# Patient Record
Sex: Male | Born: 1945 | Race: White | Hispanic: No | Marital: Married | Smoking: Current every day smoker
Health system: Southern US, Community
[De-identification: ages and names within clinical notes are randomized; demographics above are authoritative.]

## PROBLEM LIST (undated history)

## (undated) DIAGNOSIS — N486 Induration penis plastica: Secondary | ICD-10-CM

## (undated) DIAGNOSIS — F419 Anxiety disorder, unspecified: Secondary | ICD-10-CM

## (undated) DIAGNOSIS — E785 Hyperlipidemia, unspecified: Secondary | ICD-10-CM

## (undated) DIAGNOSIS — I469 Cardiac arrest, cause unspecified: Secondary | ICD-10-CM

## (undated) DIAGNOSIS — N200 Calculus of kidney: Secondary | ICD-10-CM

## (undated) DIAGNOSIS — I219 Acute myocardial infarction, unspecified: Secondary | ICD-10-CM

## (undated) DIAGNOSIS — H8110 Benign paroxysmal vertigo, unspecified ear: Secondary | ICD-10-CM

## (undated) DIAGNOSIS — J449 Chronic obstructive pulmonary disease, unspecified: Secondary | ICD-10-CM

## (undated) DIAGNOSIS — K449 Diaphragmatic hernia without obstruction or gangrene: Secondary | ICD-10-CM

## (undated) HISTORY — DX: Calculus of kidney: N20.0

## (undated) HISTORY — DX: Benign paroxysmal vertigo, unspecified ear: H81.10

## (undated) HISTORY — DX: Anxiety disorder, unspecified: F41.9

## (undated) HISTORY — PX: NOSE SURGERY: SHX723

## (undated) HISTORY — PX: TONSILLECTOMY: SUR1361

## (undated) HISTORY — DX: Hyperlipidemia, unspecified: E78.5

## (undated) HISTORY — PX: BACK SURGERY: SHX140

## (undated) HISTORY — DX: Acute myocardial infarction, unspecified: I21.9

## (undated) HISTORY — DX: Induration penis plastica: N48.6

## (undated) HISTORY — DX: Cardiac arrest, cause unspecified: I46.9

## (undated) HISTORY — DX: Diaphragmatic hernia without obstruction or gangrene: K44.9

## (undated) HISTORY — DX: Chronic obstructive pulmonary disease, unspecified: J44.9

---

## 2021-09-28 DIAGNOSIS — R9431 Abnormal electrocardiogram [ECG] [EKG]: Secondary | ICD-10-CM | POA: Insufficient documentation

## 2021-09-28 NOTE — Progress Notes (Addendum)
Cardiology Office Note   Date:  09/30/2021   ID:  Arthur Sullivan, DOB 1946-01-27, MRN [id number]  PCP:  Oneita Hurt, No  Cardiologist:   Rollene Rotunda, MD Referring:  Veverly Fells, MD  Chief Complaint  Patient presents with   Shortness of Breath   Neck Pain       History of Present Illness: Arthur Sullivan is a 75 y.o. male who presents for evaluation of throat pain.  He was referred by Veverly Fells, MD. he has an extensive cardiac history but unfortunately I cannot get any access to these records.  We are looking for them in Care Everywhere because he says he was recently treated both at [facility] and [facility] earlier this year.  He said in 2001 he had 4 stents at [facility]  In 2004 he had 1 stent.  He was at [facility] he said in March of this year and required a stent in [facility] in April and got a balloon.  However, I cannot access these results.     He says that he is still having throat pain although it is not nearly as severe as it was in March.  He has not taken any nitroglycerin because he says he has an allergy to it.  He was seen by cardiologist but he wanted to switch to our office.  The last visit they tried apparently to give him some isosorbide but he said he has not tolerated this.  He said the he said that the throat discomfort comes sporadically.  It seems to happen at rest.  He is not describing really new substernal chest pressure, arm discomfort, associated nausea vomiting or diaphoresis.  He does have some chronic shortness of breath but he is not describing PND or orthopnea.  He describes pain in both of his feet.  He has had no weight gain or edema.  Of note apparently to evaluate his rib pain he did see ENT but they did not think it was related to an ear nose and throat issue.  He does not report any extensive examination or evaluation   Past Medical History:  Diagnosis Date   Anxiety    Benign positional vertigo    Cardiac arrest [facility])    History of this  with a report of 5 stents.   COPD (chronic obstructive pulmonary disease) (HCC)    Dyslipidemia    Hiatal hernia    Hyperlipidemia    Kidney stones    Myocardial infarction (HCC)    Peyronie's disease     Past Surgical History:  Procedure Laterality Date   BACK SURGERY     NOSE SURGERY     TONSILLECTOMY       Current Outpatient Medications  Medication Sig Dispense Refill   albuterol (VENTOLIN HFA) 108 (90 Base) MCG/ACT inhaler Inhale into the lungs.     ALPHAGAN P 0.1 % SOLN Apply to eye.     aspirin EC 81 MG tablet Take 81 mg by mouth daily. Swallow whole.     atorvastatin (LIPITOR) 40 MG tablet Take 40 mg by mouth daily as needed.     clopidogrel (PLAVIX) 75 MG tablet Take 75 mg by mouth daily.     fluticasone (FLONASE) 50 MCG/ACT nasal spray Place into both nostrils.     furosemide (LASIX) 20 MG tablet Take 20 mg by mouth daily.     latanoprost (XALATAN) 0.005 % ophthalmic solution SMARTSIG:In Eye(s)     LORazepam (ATIVAN)  0.5 MG tablet Take 0.5 mg by mouth 3 (three) times daily as needed.     metoprolol tartrate (LOPRESSOR) 25 MG tablet Take 25 mg by mouth 2 (two) times daily.     pantoprazole (PROTONIX) 40 MG tablet Take 40 mg by mouth daily.     timolol (TIMOPTIC) 0.5 % ophthalmic solution SMARTSIG:In Eye(s)     isosorbide mononitrate (IMDUR) 30 MG 24 hr tablet Take 15 mg by mouth daily. (Patient not taking: Reported on 09/30/2021)     No current facility-administered medications for this visit.    Allergies:   Patient has no allergy information on record. 2   Social History:  The patient  reports that he has been smoking cigarettes. He has never used smokeless tobacco.   Family History:   No family history of CAD.   ROS:  Please see the history of present illness.   Otherwise, review of systems are positive for none.   All other systems are reviewed and negative.    PHYSICAL EXAM: VS:  BP (!) 145/76    Pulse (!) 54    Ht 5\' 5"  (1.651 m)    Wt 124 lb 3.2 oz  (56.3 kg)    SpO2 99%    BMI 20.67 kg/m  , BMI Body mass index is 20.67 kg/m. GENERAL:  Well appearing HEENT:  Pupils equal round and reactive, fundi not visualized, oral mucosa unremarkable NECK:  No jugular venous distention, waveform within normal limits, carotid upstroke brisk and symmetric, no bruits, no thyromegaly LYMPHATICS:  No cervical, inguinal adenopathy LUNGS:  Clear to auscultation bilaterally BACK:  No CVA tenderness CHEST:  Unremarkable HEART:  PMI not displaced or sustained,S1 and S2 within normal limits, no S3, no S4, no clicks, no rubs, no murmurs ABD:  Flat, positive bowel sounds normal in frequency in pitch, no bruits, no rebound, no guarding, no midline pulsatile mass, no hepatomegaly, no splenomegaly EXT:  2 plus pulses throughout, no edema, no cyanosis no clubbing SKIN:  No rashes no nodules NEURO:  Cranial nerves II through XII grossly intact, motor grossly intact throughout PSYCH:  Cognitively intact, oriented to person place and time   EKG:  EKG is ordered today. The ekg ordered today demonstrates sinus rhythm, rate 54, axis within normal limits, intervals within normal limits, no acute ST-T wave changes.   Recent Labs: No results found for requested labs within last 8760 hours.    Lipid Panel No results found for: CHOL, TRIG, HDL, CHOLHDL, VLDL, LDLCALC, LDLDIRECT    Wt Readings from Last 3 Encounters:  09/30/21 124 lb 3.2 oz (56.3 kg)      Other studies Reviewed: Additional studies/ records that were reviewed today include: [facility]. Review of the above records demonstrates:  Please see elsewhere in the note.     ASSESSMENT AND PLAN:  THROAT PAIN:  I would like to understand his anatomy with the plan as below.   SOB: I would like to start with pulmonary function testing.  Of course he needs to stop smoking.    FOOT PAIN:    I will check ABIs.      TOBACCO ABUSE:  He understands the need to quit smoking.    CAD:  It is unclear whether his throat pain is related to his coronary disease.  I need to first understand his coronary anatomy so we are trying to call [place] and [facility] to get those records.  He does not want to take nitroglycerin.  He does not want to try the Imdur.  At this point I do not want to start Ranexa until I understand more about his coronary anatomy.  He seems to be a stable pattern improved from earlier this year.   ADDENDUM: I was able to find records from both [place] and at [facility].  In March he was at [place].  He ruled in for NSTEMI initially but left AGAINST MEDICAL ADVICE.  He had recurrent chest discomfort and came in and was found to have LAD 20 to 30% stenosis, left circumflex 99% stenosis and a previous stent and it was not thought to be amenable to PCI.  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  In early April at [facility] catheterization demonstrated that the RCA was patent.  At [facility] it looks like they were not able to do a balloon angioplasty but could not pass a stent.  Was described as a tortuous origin with calcification of this OM.  No stent was deployed.  They suggested that no further attempts at the OM and revascularize should be attempted because of the poor target.  Current medicines are reviewed at length with the patient today.  The patient does not have concerns regarding medicines.  The following changes have been made:  no change  Labs/ tests ordered today include:   Orders Placed This Encounter  Procedures   EKG 12-Lead   Pulmonary Function Test   VAS [place] ABI WITH/WO TBI      Disposition:   FU with me after the above testing.  He can see me in [place].      Signed, Rollene Rotunda, MD  09/30/2021 12:34 PM     [facility]

## 2021-09-30 ENCOUNTER — Encounter: Payer: Self-pay | Admitting: Cardiology

## 2021-09-30 ENCOUNTER — Other Ambulatory Visit: Payer: Self-pay

## 2021-09-30 ENCOUNTER — Ambulatory Visit (INDEPENDENT_AMBULATORY_CARE_PROVIDER_SITE_OTHER): Payer: Medicare Other | Admitting: Cardiology

## 2021-09-30 VITALS — BP 145/76 | HR 54 | Ht 65.0 in | Wt 124.2 lb

## 2021-09-30 DIAGNOSIS — M79606 Pain in leg, unspecified: Secondary | ICD-10-CM | POA: Diagnosis not present

## 2021-09-30 DIAGNOSIS — R9431 Abnormal electrocardiogram [ECG] [EKG]: Secondary | ICD-10-CM

## 2021-09-30 NOTE — Patient Instructions (Signed)
Medication Instructions:  Your Physician recommend you continue on your current medication as directed.    *If you need a refill on your cardiac medications before your next appointment, please call your pharmacy*  Testing/Procedures: Your physician has requested that you have an ankle brachial index (ABI). During this test an ultrasound and blood pressure cuff are used to evaluate the arteries that supply the arms and legs with blood. Allow thirty minutes for this exam. There are no restrictions or special instructions.  Your physician has recommended that you have a pulmonary function test. Pulmonary Function Tests are a group of tests that measure how well air moves in and out of your lungs.  Follow-Up: At [facility], you and your health needs are our priority.  As part of our continuing mission to provide you with exceptional heart care, we have created designated Provider Care Teams.  These Care Teams include your primary Cardiologist (physician) and Advanced Practice Providers (APPs -  Physician Assistants and Nurse Practitioners) who all work together to provide you with the care you need, when you need it.  We recommend signing up for the patient portal called "MyChart".  Sign up information is provided on this After Visit Summary.  MyChart is used to connect with patients for Virtual Visits (Telemedicine).  Patients are able to view lab/test results, encounter notes, upcoming appointments, etc.  Non-urgent messages can be sent to your provider as well.   To learn more about what you can do with MyChart, go to [email or web address].    Your next appointment:   2 week(s)  The format for your next appointment:   In Person  Provider:   Rollene Rotunda, MD

## 2021-10-27 ENCOUNTER — Other Ambulatory Visit: Payer: Self-pay

## 2021-10-27 ENCOUNTER — Ambulatory Visit (HOSPITAL_COMMUNITY)
Admission: RE | Admit: 2021-10-27 | Discharge: 2021-10-27 | Disposition: A | Discharge status: Home / Self Care | Payer: Commercial Managed Care - HMO | Source: Ambulatory Visit | Attending: Cardiology | Admitting: Cardiology

## 2021-10-27 DIAGNOSIS — M79606 Pain in leg, unspecified: Secondary | ICD-10-CM | POA: Insufficient documentation

## 2021-10-27 DIAGNOSIS — R9431 Abnormal electrocardiogram [ECG] [EKG]: Secondary | ICD-10-CM | POA: Diagnosis not present

## 2021-10-29 ENCOUNTER — Other Ambulatory Visit (HOSPITAL_COMMUNITY)
Admission: RE | Admit: 2021-10-29 | Discharge: 2021-10-29 | Disposition: A | Discharge status: Home / Self Care | Payer: Commercial Managed Care - HMO | Source: Ambulatory Visit | Attending: Cardiology | Admitting: Cardiology

## 2021-10-29 DIAGNOSIS — Z01812 Encounter for preprocedural laboratory examination: Secondary | ICD-10-CM | POA: Diagnosis present

## 2021-10-29 DIAGNOSIS — Z20822 Contact with and (suspected) exposure to covid-19: Secondary | ICD-10-CM | POA: Diagnosis not present

## 2021-10-29 DIAGNOSIS — R9431 Abnormal electrocardiogram [ECG] [EKG]: Secondary | ICD-10-CM | POA: Insufficient documentation

## 2021-10-30 LAB — SARS CORONAVIRUS 2 (TAT 6-24 HRS): SARS Coronavirus 2: NEGATIVE

## 2021-11-02 ENCOUNTER — Other Ambulatory Visit: Payer: Self-pay

## 2021-11-02 ENCOUNTER — Ambulatory Visit (HOSPITAL_COMMUNITY)
Admission: RE | Admit: 2021-11-02 | Discharge: 2021-11-02 | Disposition: A | Discharge status: Home / Self Care | Payer: Medicare Other | Source: Ambulatory Visit | Attending: Cardiology | Admitting: Cardiology

## 2021-11-02 ENCOUNTER — Encounter (HOSPITAL_COMMUNITY): Payer: Medicare Other

## 2021-11-02 DIAGNOSIS — R9431 Abnormal electrocardiogram [ECG] [EKG]: Secondary | ICD-10-CM | POA: Diagnosis present

## 2021-11-02 DIAGNOSIS — Z720 Tobacco use: Secondary | ICD-10-CM | POA: Insufficient documentation

## 2021-11-02 DIAGNOSIS — R0609 Other forms of dyspnea: Secondary | ICD-10-CM | POA: Insufficient documentation

## 2021-11-02 DIAGNOSIS — R0602 Shortness of breath: Secondary | ICD-10-CM | POA: Insufficient documentation

## 2021-11-02 DIAGNOSIS — F17210 Nicotine dependence, cigarettes, uncomplicated: Secondary | ICD-10-CM | POA: Insufficient documentation

## 2021-11-02 DIAGNOSIS — I2510 Atherosclerotic heart disease of native coronary artery without angina pectoris: Secondary | ICD-10-CM | POA: Insufficient documentation

## 2021-11-02 LAB — PULMONARY FUNCTION TEST
DL/VA % pred: 83 %
DL/VA: 3.4 ml/min/mmHg/L
DLCO unc % pred: 62 %
DLCO unc: 13.42 ml/min/mmHg
FEF 25-75 Post: 0.78 L/sec
FEF 25-75 Pre: 0.85 L/sec
FEF2575-%Change-Post: -8 %
FEF2575-%Pred-Post: 42 %
FEF2575-%Pred-Pre: 46 %
FEV1-%Change-Post: -3 %
FEV1-%Pred-Post: 55 %
FEV1-%Pred-Pre: 57 %
FEV1-Post: 1.39 L
FEV1-Pre: 1.44 L
FEV1FVC-%Change-Post: 5 %
FEV1FVC-%Pred-Pre: 94 %
FEV6-%Change-Post: -9 %
FEV6-%Pred-Post: 58 %
FEV6-%Pred-Pre: 64 %
FEV6-Post: 1.9 L
FEV6-Pre: 2.09 L
FEV6FVC-%Change-Post: 0 %
FEV6FVC-%Pred-Post: 106 %
FEV6FVC-%Pred-Pre: 107 %
FVC-%Change-Post: -8 %
FVC-%Pred-Post: 55 %
FVC-%Pred-Pre: 60 %
FVC-Post: 1.92 L
FVC-Pre: 2.1 L
Post FEV1/FVC ratio: 72 %
Post FEV6/FVC ratio: 99 %
Pre FEV1/FVC ratio: 69 %
Pre FEV6/FVC Ratio: 100 %
RV % pred: 133 %
RV: 3.01 L
TLC % pred: 85 %
TLC: 5.18 L

## 2021-11-02 MED ORDER — ALBUTEROL SULFATE (2.5 MG/3ML) 0.083% IN NEBU
2.5000 mg | INHALATION_SOLUTION | Freq: Once | RESPIRATORY_TRACT | Status: AC
  Administered 2021-11-02: 2.5 mg via RESPIRATORY_TRACT

## 2021-11-02 NOTE — Progress Notes (Addendum)
Cardiology Office Note   Date:  11/02/2021   ID:  [name], [name] 12/20/1945, MRN [id number]  PCP:  Veverly Fells, MD  Cardiologist:   Rollene Rotunda, MD Referring:  No ref. provider found  No chief complaint on file.      History of Present Illness: Arthur Sullivan is a 76 y.o. male who presents for evaluation of CAD.  He said in 2001 he had 4 stents at [facility].  In 2004 he had 1 stent.  He was at [facility] he said in March of this 2022 and required a stent .  In [facility] in April and got a balloon.  At the last visit I had limited access to records.   I was eventually able to find records from both [place] and at [facility].  In March 2022 he was at [place].  He ruled in for NSTEMI initially but left AGAINST MEDICAL ADVICE.  He had recurrent chest discomfort and came in and was found to have LAD 20 to 30% stenosis, left circumflex 99% stenosis and a previous stent and it was not thought to be amenable to PCI.  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  In early April at [facility] catheterization demonstrated that the RCA was patent.  At [facility] it looks like they were not able to do a balloon angioplasty but could not pass a stent.  Was described as a tortuous origin with calcification of this OM.  No stent was deployed.  They suggested that no further attempts at the OM and revascularize should be attempted because of the poor target.  I sent him for evaluation of SOB and he has moderately severe airway obstruction.  I sent him for ABIs for foot pain and these were normal.  He continues to get throat discomfort.  This is with minimal activity.  He actually cannot take sublingual nitroglycerin because it makes him have more pain.  He thinks the discomfort in his throat is similar to previous angina and getting worse.  It happens when he is just walking across level ground.  He has not had any resting and is not having any PND or orthopnea.  He  does have chronic dyspnea with exertion.  He has had no chest pressure or arm discomfort.  He does not have palpitations, presyncope or syncope.   Past Medical History:  Diagnosis Date   Anxiety    Benign positional vertigo    Cardiac arrest [facility])    History of this with a report of 5 stents.   COPD (chronic obstructive pulmonary disease) (HCC)    Dyslipidemia    Hiatal hernia    Hyperlipidemia    Kidney stones    Myocardial infarction (HCC)    Peyronie's disease     Past Surgical History:  Procedure Laterality Date   BACK SURGERY     NOSE SURGERY     TONSILLECTOMY       Current Outpatient Medications  Medication Sig Dispense Refill   albuterol (VENTOLIN HFA) 108 (90 Base) MCG/ACT inhaler Inhale into the lungs.     ALPHAGAN P 0.1 % SOLN Apply to eye.     aspirin EC 81 MG tablet Take 81 mg by mouth daily. Swallow whole.     atorvastatin (LIPITOR) 40 MG tablet Take 40 mg by mouth daily as needed.     clopidogrel (PLAVIX) 75 MG tablet Take 75 mg by mouth daily.  fluticasone (FLONASE) 50 MCG/ACT nasal spray Place into both nostrils.     furosemide (LASIX) 20 MG tablet Take 20 mg by mouth daily.     isosorbide mononitrate (IMDUR) 30 MG 24 hr tablet Take 15 mg by mouth daily. (Patient not taking: Reported on 09/30/2021)     latanoprost (XALATAN) 0.005 % ophthalmic solution SMARTSIG:In Eye(s)     LORazepam (ATIVAN) 0.5 MG tablet Take 0.5 mg by mouth 3 (three) times daily as needed.     metoprolol tartrate (LOPRESSOR) 25 MG tablet Take 25 mg by mouth 2 (two) times daily.     pantoprazole (PROTONIX) 40 MG tablet Take 40 mg by mouth daily.     timolol (TIMOPTIC) 0.5 % ophthalmic solution SMARTSIG:In Eye(s)     No current facility-administered medications for this visit.    Allergies:   Patient has no allergy information on record. 2  ROS:  Please see the history of present illness.   Otherwise, review of systems are positive for none.   All other systems are reviewed and  negative.    PHYSICAL EXAM: VS:  There were no vitals taken for this visit. , BMI There is no height or weight on file to calculate BMI. GENERAL:  Well appearing NECK:  No jugular venous distention, waveform within normal limits, carotid upstroke brisk and symmetric, bilateral carotid bruits bruits, no thyromegaly LUNGS:  Clear to auscultation bilaterally CHEST:  Unremarkable HEART:  PMI not displaced or sustained,S1 and S2 within normal limits, no S3, no S4, no clicks, no rubs,  2 out of 6 apical systolic murmur radiating slightly up aortic outflow tract, no diastolic ABD:  Flat, positive bowel sounds normal in frequency in pitch, no bruits, no rebound, no guarding, no midline pulsatile mass, no hepatomegaly, no splenomegaly EXT:  2 plus pulses diminished dorsalis pedis and posterior tibialis bilaterally, no edema, no cyanosis no clubbing   EKG:  EKG is not ordered today. The ekg ordered today demonstrates sinus rhythm, rate 55 axis within normal limits, intervals within normal limits, no acute ST-T wave changes.   Recent Labs: No results found for requested labs within last 8760 hours.    Lipid Panel No results found for: CHOL, TRIG, HDL, CHOLHDL, VLDL, LDLCALC, LDLDIRECT    Wt Readings from Last 3 Encounters:  09/30/21 124 lb 3.2 oz (56.3 kg)      Other studies Reviewed: Additional studies/ records that were reviewed today include: [facility] and [facility] records  ( (Greater than 40 minutes reviewing all data with greater than 50% face to face with the patient). Review of the above records demonstrates:  Please see elsewhere in the note.     ASSESSMENT AND PLAN:  THROAT PAIN:    His pain is consistent with unstable angina.  He does not tolerate Imdur.  I am going to start Ranexa 500 mg twice daily.  Of note I did review the report from his 2 previous hospitalizations.  Given the unstable nature of his symptoms I think it is reasonable to consider another cardiac catheterization.  The  patient understands that risks included but are not limited to stroke (1 in 1000), death (1 in 1000), kidney failure [usually temporary] (1 in 500), bleeding (1 in 200), allergic reaction [possibly serious] (1 in 200).  The patient understands and agrees to proceed.      SOB:    He needs a pulmonary appointment and I will arrange.    FOOT PAIN:    He had normal ABIs.  TOBACCO ABUSE: He understands the need to stop smoking. ° °CAD:   This will be addressed as above. ° °CAROTID BRUITS: I will check carotid Dopplers ° °ANEMIA: I note that he has an anemia at least to get in April.  I will be checking a CBC with his routine labs but has had no active bleeding.  He does not know that he had any anemia.  He has been tolerating the aspirin and Plavix. ° ° °ADDENDUM: After arranging the catheterization.  The patient decided not to have the procedure but does agree to take medication and come to the emergency room via EMS if he has increasing unstable symptoms.  He understands the risk and accepts his decision not to have a catheterization. ° ° °Current medicines are reviewed at length with the patient today.  The patient does not have concerns regarding medicines. ° °The following changes have been made: As above ° °Labs/ tests ordered today include:    ° °No orders of the defined types were placed in this encounter. ° ° ° ° °Disposition:   Follow-up with me after the cardiac cath ° ° °Signed, °Jasmaine Rochel, MD  °11/02/2021 1:31 PM    °[facility] ° ° ° °

## 2021-11-02 NOTE — H&P (View-Only) (Signed)
Cardiology Office Note   Date:  11/02/2021   ID:  [name], [name] 12/20/1945, MRN [id number]  PCP:  Veverly Fells, MD  Cardiologist:   Rollene Rotunda, MD Referring:  No ref. provider found  No chief complaint on file.      History of Present Illness: Arthur Sullivan is a 76 y.o. male who presents for evaluation of CAD.  He said in 2001 he had 4 stents at [facility].  In 2004 he had 1 stent.  He was at [facility] he said in March of this 2022 and required a stent .  In [facility] in April and got a balloon.  At the last visit I had limited access to records.   I was eventually able to find records from both [place] and at [facility].  In March 2022 he was at [facility].  He ruled in for NSTEMI initially but left AGAINST MEDICAL ADVICE.  He had recurrent chest discomfort and came in and was found to have LAD 20 to 30% stenosis, left circumflex 99% stenosis and a previous stent and it was not thought to be amenable to PCI.  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  In early April at [facility] catheterization demonstrated that the RCA was patent.  At [facility] it looks like they were not able to do a balloon angioplasty but could not pass a stent.  Was described as a tortuous origin with calcification of this OM.  No stent was deployed.  They suggested that no further attempts at the OM and revascularize should be attempted because of the poor target.  I sent him for evaluation of SOB and he has moderately severe airway obstruction.  I sent him for ABIs for foot pain and these were normal.  He continues to get throat discomfort.  This is with minimal activity.  He actually cannot take sublingual nitroglycerin because it makes him have more pain.  He thinks the discomfort in his throat is similar to previous angina and getting worse.  It happens when he is just walking across level ground.  He has not had any resting and is not having any PND or orthopnea.  He  does have chronic dyspnea with exertion.  He has had no chest pressure or arm discomfort.  He does not have palpitations, presyncope or syncope.   Past Medical History:  Diagnosis Date   Anxiety    Benign positional vertigo    Cardiac arrest [facility])    History of this with a report of 5 stents.   COPD (chronic obstructive pulmonary disease) (HCC)    Dyslipidemia    Hiatal hernia    Hyperlipidemia    Kidney stones    Myocardial infarction (HCC)    Peyronie's disease     Past Surgical History:  Procedure Laterality Date   BACK SURGERY     NOSE SURGERY     TONSILLECTOMY       Current Outpatient Medications  Medication Sig Dispense Refill   albuterol (VENTOLIN HFA) 108 (90 Base) MCG/ACT inhaler Inhale into the lungs.     ALPHAGAN P 0.1 % SOLN Apply to eye.     aspirin EC 81 MG tablet Take 81 mg by mouth daily. Swallow whole.     atorvastatin (LIPITOR) 40 MG tablet Take 40 mg by mouth daily as needed.     clopidogrel (PLAVIX) 75 MG tablet Take 75 mg by mouth daily.  fluticasone (FLONASE) 50 MCG/ACT nasal spray Place into both nostrils.     furosemide (LASIX) 20 MG tablet Take 20 mg by mouth daily.     isosorbide mononitrate (IMDUR) 30 MG 24 hr tablet Take 15 mg by mouth daily. (Patient not taking: Reported on 09/30/2021)     latanoprost (XALATAN) 0.005 % ophthalmic solution SMARTSIG:In Eye(s)     LORazepam (ATIVAN) 0.5 MG tablet Take 0.5 mg by mouth 3 (three) times daily as needed.     metoprolol tartrate (LOPRESSOR) 25 MG tablet Take 25 mg by mouth 2 (two) times daily.     pantoprazole (PROTONIX) 40 MG tablet Take 40 mg by mouth daily.     timolol (TIMOPTIC) 0.5 % ophthalmic solution SMARTSIG:In Eye(s)     No current facility-administered medications for this visit.    Allergies:   Patient has no allergy information on record. 2  ROS:  Please see the history of present illness.   Otherwise, review of systems are positive for none.   All other systems are reviewed and  negative.    PHYSICAL EXAM: VS:  There were no vitals taken for this visit. , BMI There is no height or weight on file to calculate BMI. GENERAL:  Well appearing NECK:  No jugular venous distention, waveform within normal limits, carotid upstroke brisk and symmetric, bilateral carotid bruits bruits, no thyromegaly LUNGS:  Clear to auscultation bilaterally CHEST:  Unremarkable HEART:  PMI not displaced or sustained,S1 and S2 within normal limits, no S3, no S4, no clicks, no rubs,  2 out of 6 apical systolic murmur radiating slightly up aortic outflow tract, no diastolic ABD:  Flat, positive bowel sounds normal in frequency in pitch, no bruits, no rebound, no guarding, no midline pulsatile mass, no hepatomegaly, no splenomegaly EXT:  2 plus pulses diminished dorsalis pedis and posterior tibialis bilaterally, no edema, no cyanosis no clubbing   EKG:  EKG is not ordered today. The ekg ordered today demonstrates sinus rhythm, rate 55 axis within normal limits, intervals within normal limits, no acute ST-T wave changes.   Recent Labs: No results found for requested labs within last 8760 hours.    Lipid Panel No results found for: CHOL, TRIG, HDL, CHOLHDL, VLDL, LDLCALC, LDLDIRECT    Wt Readings from Last 3 Encounters:  09/30/21 124 lb 3.2 oz (56.3 kg)      Other studies Reviewed: Additional studies/ records that were reviewed today include: [facility] and [facility] records  ( (Greater than 40 minutes reviewing all data with greater than 50% face to face with the patient). Review of the above records demonstrates:  Please see elsewhere in the note.     ASSESSMENT AND PLAN:  THROAT PAIN:    His pain is consistent with unstable angina.  He does not tolerate Imdur.  I am going to start Ranexa 500 mg twice daily.  Of note I did review the report from his 2 previous hospitalizations.  Given the unstable nature of his symptoms I think it is reasonable to consider another cardiac catheterization.  The  patient understands that risks included but are not limited to stroke (1 in 1000), death (1 in 1000), kidney failure [usually temporary] (1 in 500), bleeding (1 in 200), allergic reaction [possibly serious] (1 in 200).  The patient understands and agrees to proceed.      SOB:    He needs a pulmonary appointment and I will arrange.    FOOT PAIN:    He had normal ABIs.  TOBACCO ABUSE: He understands the need to stop smoking.  CAD:   This will be addressed as above.  CAROTID BRUITS: I will check carotid Dopplers  ANEMIA: I note that he has an anemia at least to get in April.  I will be checking a CBC with his routine labs but has had no active bleeding.  He does not know that he had any anemia.  He has been tolerating the aspirin and Plavix.   ADDENDUM: After arranging the catheterization.  The patient decided not to have the procedure but does agree to take medication and come to the emergency room via EMS if he has increasing unstable symptoms.  He understands the risk and accepts his decision not to have a catheterization.   Current medicines are reviewed at length with the patient today.  The patient does not have concerns regarding medicines.  The following changes have been made: As above  Labs/ tests ordered today include:     No orders of the defined types were placed in this encounter.     Disposition:   Follow-up with me after the cardiac cath   Signed, Rollene RotundaJames Deshanti Adcox, MD  11/02/2021 1:31 PM    [facility]

## 2021-11-03 ENCOUNTER — Other Ambulatory Visit: Payer: Self-pay | Admitting: *Deleted

## 2021-11-03 ENCOUNTER — Ambulatory Visit (INDEPENDENT_AMBULATORY_CARE_PROVIDER_SITE_OTHER): Payer: Medicare Other | Admitting: Cardiology

## 2021-11-03 ENCOUNTER — Other Ambulatory Visit: Payer: Self-pay

## 2021-11-03 ENCOUNTER — Encounter: Payer: Self-pay | Admitting: Cardiology

## 2021-11-03 VITALS — BP 136/66 | HR 57 | Ht 65.0 in | Wt 127.2 lb

## 2021-11-03 DIAGNOSIS — R0602 Shortness of breath: Secondary | ICD-10-CM

## 2021-11-03 DIAGNOSIS — Z01812 Encounter for preprocedural laboratory examination: Secondary | ICD-10-CM

## 2021-11-03 DIAGNOSIS — Z720 Tobacco use: Secondary | ICD-10-CM

## 2021-11-03 DIAGNOSIS — Z01818 Encounter for other preprocedural examination: Secondary | ICD-10-CM

## 2021-11-03 DIAGNOSIS — I2510 Atherosclerotic heart disease of native coronary artery without angina pectoris: Secondary | ICD-10-CM

## 2021-11-03 DIAGNOSIS — I25118 Atherosclerotic heart disease of native coronary artery with other forms of angina pectoris: Secondary | ICD-10-CM

## 2021-11-03 DIAGNOSIS — R0989 Other specified symptoms and signs involving the circulatory and respiratory systems: Secondary | ICD-10-CM

## 2021-11-03 MED ORDER — RANOLAZINE ER 500 MG PO TB12
500.0000 mg | ORAL_TABLET | Freq: Two times a day (BID) | ORAL | 11 refills | Status: DC
Start: 2021-11-03 — End: 2021-12-10

## 2021-11-03 NOTE — Patient Instructions (Addendum)
Medication Instructions:  Please start Ranexa 500 mg one tablet twice a day. Continue all other medications as listed.  *If you need a refill on your cardiac medications before your next appointment, please call your pharmacy*   Lab Work: Please have blood work at your closest [facility]  If you have labs (blood work) drawn today and your tests are completely normal, you will receive your results only by: MyChart Message (if you have MyChart) OR A paper copy in the mail If you have any lab test that is abnormal or we need to change your treatment, we will call you to review the results.   Testing/Procedures: Your physician has requested that you have a carotid duplex. This test is an ultrasound of the carotid arteries in your neck. It looks at blood flow through these arteries that supply the brain with blood. Allow one hour for this exam. There are no restrictions or special instructions.  This will be completed at [facility].  You will be contacted to be scheduled.  You have been referred to pulmonary for the evaluation of shortness of breath.  You will be  contacted to be scheduled.   [facility] CARDIOVASCULAR DIVISION [facility] [place] [address] [address] Dept: [phone] Loc: [phone]  VISHNU MOELLER  11/03/2021    PT WILL CALL BACK TO RESCHEDULE CATH!!!  You are scheduled for a Cardiac Catheterization on Tuesday, January 24 with Dr. Nicki Guadalajara.  1. Please arrive at the [facility] (Main Entrance A) at [facility]: [address] [place], [place] [address] at 7:00 AM (This time is two hours before your procedure to ensure your preparation). Free valet parking service is available.   Special note: Every effort is made to have your procedure done on time. Please understand that emergencies sometimes delay scheduled procedures.  2. Diet: Do not eat solid foods after midnight.  The patient may have clear  liquids until 5am upon the day of the procedure.  3. Labs: You will need to have blood work as instructed at your closest [place]. You do not need to be fasting.  4. Medication instructions in preparation for your procedure:   Contrast Allergy: No  Please do not take your Lasix this AM  On the morning of your procedure, take your ASA and  Plavix/Clopidogrel and any morning medicines NOT listed above.  You may use sips of water.  5. Plan for one night stay--bring personal belongings. 6. Bring a current list of your medications and current insurance cards. 7. You MUST have a responsible person to drive you home. 8. Someone MUST be with you the first 24 hours after you arrive home or your discharge will be delayed. 9. Please wear clothes that are easy to get on and off and wear slip-on shoes.  Thank you for allowing [place] to care for you!   -- [facility]  Follow-Up: At [facility], you and your health needs are our priority.  As part of our continuing mission to provide you with exceptional heart care, we have created designated Provider Care Teams.  These Care Teams include your primary Cardiologist (physician) and Advanced Practice Providers (APPs -  Physician Assistants and Nurse Practitioners) who all work together to provide you with the care you need, when you need it.  We recommend signing up for the patient portal called "MyChart".  Sign up information is provided on this After Visit Summary.  MyChart is used to connect with patients for Virtual Visits (Telemedicine).  Patients are able to view lab/test results, encounter notes, upcoming appointments, etc.  Non-urgent messages can be sent to your provider as well.   To learn more about what you can do with MyChart, go to [email or web address].    Your next appointment:   Follow up with Dr Antoine Poche after the above testing.  Thank you for choosing [facility]!!

## 2021-11-08 ENCOUNTER — Telehealth: Payer: Self-pay | Admitting: Cardiology

## 2021-11-08 NOTE — Telephone Encounter (Signed)
Pt is reaching out to reschedule heart cath.. please advise

## 2021-11-08 NOTE — Telephone Encounter (Signed)
Spoke with patient and he could not have cath this week but next week ok Scheduled patient for 1/31 arrive at 8:30 for 10:30 cath  Patient aware of date, time and location

## 2021-11-09 ENCOUNTER — Encounter (HOSPITAL_COMMUNITY): Payer: Self-pay

## 2021-11-09 ENCOUNTER — Ambulatory Visit (HOSPITAL_COMMUNITY): Admit: 2021-11-09 | Payer: Commercial Managed Care - HMO | Admitting: Cardiovascular Disease

## 2021-11-09 SURGERY — LEFT HEART CATH AND CORONARY ANGIOGRAPHY
Anesthesia: LOCAL

## 2021-11-13 LAB — CBC
Hematocrit: 35.3 % — ABNORMAL LOW (ref 37.5–51.0)
Hemoglobin: 12 g/dL — ABNORMAL LOW (ref 13.0–17.7)
MCH: 31.6 pg (ref 26.6–33.0)
MCHC: 34 g/dL (ref 31.5–35.7)
MCV: 93 fL (ref 79–97)
Platelets: 319 10*3/uL (ref 150–450)
RBC: 3.8 x10E6/uL — ABNORMAL LOW (ref 4.14–5.80)
RDW: 13.9 % (ref 11.6–15.4)
WBC: 6.8 10*3/uL (ref 3.4–10.8)

## 2021-11-13 LAB — BASIC METABOLIC PANEL
BUN/Creatinine Ratio: 22 (ref 10–24)
BUN: 15 mg/dL (ref 8–27)
CO2: 24 mmol/L (ref 20–29)
Calcium: 8.9 mg/dL (ref 8.6–10.2)
Chloride: 106 mmol/L (ref 96–106)
Creatinine, Ser: 0.68 mg/dL — ABNORMAL LOW (ref 0.76–1.27)
Glucose: 86 mg/dL (ref 70–99)
Potassium: 4.8 mmol/L (ref 3.5–5.2)
Sodium: 142 mmol/L (ref 134–144)
eGFR: 98 mL/min/{1.73_m2} (ref >59)

## 2021-11-15 ENCOUNTER — Telehealth: Payer: Self-pay | Admitting: *Deleted

## 2021-11-15 NOTE — Telephone Encounter (Signed)
Cardiac catheterization scheduled at [facility] for: Tuesday November 16, 2021 10:30 AM [facility] Main Entrance A [facility]) at: 8:30 AM   Diet-no solid food after midnight prior to cath, clear liquids until 5 AM day of procedure.  Medication instructions for procedure: -Hold:  Lasix-AM of procedure -Except hold medications usual morning medications can be taken pre-cath with sips of water including aspirin 81 mg and Plavix 75 mg    Confirmed patient has responsible adult to drive home post procedure and be with patient first 24 hours after arriving home.  [facility] does allow one visitor to accompany you and wait in the hospital waiting room while you are there for your procedure. You and your visitor will be asked to wear a mask once you enter the hospital.   Patient reports does not currently have any new symptoms concerning for COVID-19 and no household members with COVID-19 like illness.     Reviewed procedure/mask/visitor instructions with patient.

## 2021-11-16 ENCOUNTER — Ambulatory Visit (HOSPITAL_COMMUNITY)
Admission: RE | Admit: 2021-11-16 | Discharge: 2021-11-16 | Disposition: A | Discharge status: Home / Self Care | Payer: Medicare Other | Source: Ambulatory Visit | Attending: Interventional Cardiology | Admitting: Interventional Cardiology

## 2021-11-16 ENCOUNTER — Ambulatory Visit (HOSPITAL_COMMUNITY): Payer: Commercial Managed Care - HMO

## 2021-11-16 ENCOUNTER — Encounter (HOSPITAL_COMMUNITY)
Admission: RE | Disposition: A | Discharge status: Home / Self Care | Payer: Self-pay | Source: Ambulatory Visit | Attending: Interventional Cardiology

## 2021-11-16 ENCOUNTER — Other Ambulatory Visit: Payer: Self-pay

## 2021-11-16 ENCOUNTER — Encounter (HOSPITAL_COMMUNITY): Payer: Self-pay | Admitting: Interventional Cardiology

## 2021-11-16 DIAGNOSIS — I25110 Atherosclerotic heart disease of native coronary artery with unstable angina pectoris: Secondary | ICD-10-CM | POA: Insufficient documentation

## 2021-11-16 DIAGNOSIS — Z7982 Long term (current) use of aspirin: Secondary | ICD-10-CM | POA: Insufficient documentation

## 2021-11-16 DIAGNOSIS — D649 Anemia, unspecified: Secondary | ICD-10-CM | POA: Insufficient documentation

## 2021-11-16 DIAGNOSIS — Z7902 Long term (current) use of antithrombotics/antiplatelets: Secondary | ICD-10-CM | POA: Diagnosis not present

## 2021-11-16 DIAGNOSIS — Z955 Presence of coronary angioplasty implant and graft: Secondary | ICD-10-CM | POA: Diagnosis not present

## 2021-11-16 DIAGNOSIS — I25118 Atherosclerotic heart disease of native coronary artery with other forms of angina pectoris: Secondary | ICD-10-CM | POA: Diagnosis not present

## 2021-11-16 DIAGNOSIS — R0602 Shortness of breath: Secondary | ICD-10-CM | POA: Diagnosis not present

## 2021-11-16 DIAGNOSIS — M79673 Pain in unspecified foot: Secondary | ICD-10-CM | POA: Insufficient documentation

## 2021-11-16 DIAGNOSIS — Z720 Tobacco use: Secondary | ICD-10-CM | POA: Insufficient documentation

## 2021-11-16 HISTORY — PX: LEFT HEART CATH AND CORONARY ANGIOGRAPHY: CATH118249

## 2021-11-16 SURGERY — LEFT HEART CATH AND CORONARY ANGIOGRAPHY
Anesthesia: LOCAL

## 2021-11-16 MED ORDER — ONDANSETRON HCL 4 MG/2ML IJ SOLN
INTRAMUSCULAR | Status: DC | PRN
  Administered 2021-11-16: 4 mg via INTRAVENOUS

## 2021-11-16 MED ORDER — SODIUM CHLORIDE 0.9% FLUSH: 3.0000 mL | INTRAVENOUS | Status: DC | PRN

## 2021-11-16 MED ORDER — ACETAMINOPHEN 325 MG PO TABS: 650.0000 mg | ORAL_TABLET | ORAL | Status: DC | PRN

## 2021-11-16 MED ORDER — VERAPAMIL HCL 2.5 MG/ML IV SOLN
INTRAVENOUS | Status: AC
  Filled 2021-11-16: qty 2

## 2021-11-16 MED ORDER — HEPARIN SODIUM (PORCINE) 1000 UNIT/ML IJ SOLN
INTRAMUSCULAR | Status: AC
  Filled 2021-11-16: qty 10

## 2021-11-16 MED ORDER — SODIUM CHLORIDE 0.9 % WEIGHT BASED INFUSION
3.0000 mL/kg/h | INTRAVENOUS | Status: AC
  Administered 2021-11-16: 3 mL/kg/h via INTRAVENOUS

## 2021-11-16 MED ORDER — CLOPIDOGREL BISULFATE 75 MG PO TABS: 75.0000 mg | ORAL_TABLET | ORAL | Status: DC

## 2021-11-16 MED ORDER — HEPARIN (PORCINE) IN NACL 1000-0.9 UT/500ML-% IV SOLN
INTRAVENOUS | Status: AC
  Filled 2021-11-16: qty 500

## 2021-11-16 MED ORDER — HEPARIN SODIUM (PORCINE) 1000 UNIT/ML IJ SOLN
INTRAMUSCULAR | Status: DC | PRN
  Administered 2021-11-16: 3000 [IU] via INTRAVENOUS

## 2021-11-16 MED ORDER — VERAPAMIL HCL 2.5 MG/ML IV SOLN
INTRAVENOUS | Status: DC | PRN
  Administered 2021-11-16: 2.5 mg via INTRA_ARTERIAL

## 2021-11-16 MED ORDER — HYDRALAZINE HCL 20 MG/ML IJ SOLN: 10.0000 mg | INTRAMUSCULAR | Status: DC | PRN

## 2021-11-16 MED ORDER — LABETALOL HCL 5 MG/ML IV SOLN: 10.0000 mg | INTRAVENOUS | Status: DC | PRN

## 2021-11-16 MED ORDER — SODIUM CHLORIDE 0.9 % IV SOLN: INTRAVENOUS | Status: AC

## 2021-11-16 MED ORDER — SODIUM CHLORIDE 0.9 % WEIGHT BASED INFUSION: 1.0000 mL/kg/h | INTRAVENOUS | Status: DC

## 2021-11-16 MED ORDER — IOHEXOL 350 MG/ML SOLN
INTRAVENOUS | Status: DC | PRN
  Administered 2021-11-16: 40 mL

## 2021-11-16 MED ORDER — FENTANYL CITRATE (PF) 100 MCG/2ML IJ SOLN
INTRAMUSCULAR | Status: DC | PRN
  Administered 2021-11-16: 25 ug via INTRAVENOUS

## 2021-11-16 MED ORDER — SODIUM CHLORIDE 0.9 % IV SOLN: 250.0000 mL | INTRAVENOUS | Status: DC | PRN

## 2021-11-16 MED ORDER — HEPARIN (PORCINE) IN NACL 1000-0.9 UT/500ML-% IV SOLN
INTRAVENOUS | Status: DC | PRN
  Administered 2021-11-16 (×2): 500 mL

## 2021-11-16 MED ORDER — ASPIRIN 81 MG PO CHEW: 81.0000 mg | CHEWABLE_TABLET | ORAL | Status: DC

## 2021-11-16 MED ORDER — LIDOCAINE HCL (PF) 1 % IJ SOLN
INTRAMUSCULAR | Status: DC | PRN
  Administered 2021-11-16: 2 mL via INTRADERMAL

## 2021-11-16 MED ORDER — LIDOCAINE HCL (PF) 1 % IJ SOLN
INTRAMUSCULAR | Status: AC
  Filled 2021-11-16: qty 30

## 2021-11-16 MED ORDER — SODIUM CHLORIDE 0.9% FLUSH: 3.0000 mL | Freq: Two times a day (BID) | INTRAVENOUS | Status: DC

## 2021-11-16 MED ORDER — MIDAZOLAM HCL 2 MG/2ML IJ SOLN
INTRAMUSCULAR | Status: DC | PRN
  Administered 2021-11-16: 2 mg via INTRAVENOUS

## 2021-11-16 MED ORDER — FENTANYL CITRATE (PF) 100 MCG/2ML IJ SOLN
INTRAMUSCULAR | Status: AC
  Filled 2021-11-16: qty 2

## 2021-11-16 MED ORDER — VERAPAMIL HCL 2.5 MG/ML IV SOLN
INTRAVENOUS | Status: DC | PRN
  Administered 2021-11-16: 10 mL via INTRA_ARTERIAL

## 2021-11-16 MED ORDER — MIDAZOLAM HCL 2 MG/2ML IJ SOLN
INTRAMUSCULAR | Status: AC
  Filled 2021-11-16: qty 2

## 2021-11-16 MED ORDER — ONDANSETRON HCL 4 MG/2ML IJ SOLN: 4.0000 mg | Freq: Four times a day (QID) | INTRAMUSCULAR | Status: DC | PRN

## 2021-11-16 SURGICAL SUPPLY — 12 items
CATH 5FR JL3.5 JR4 ANG PIG MP (CATHETERS) ×1 IMPLANT
DEVICE RAD COMP TR BAND LRG (VASCULAR PRODUCTS) ×1 IMPLANT
GLIDESHEATH SLEND SS 6F .021 (SHEATH) ×1 IMPLANT
GUIDEWIRE INQWIRE 1.5J.035X260 (WIRE) IMPLANT
INQWIRE 1.5J .035X260CM (WIRE) ×2
KIT HEART LEFT (KITS) ×2 IMPLANT
MAT PREVALON FULL STRYKER (MISCELLANEOUS) ×1 IMPLANT
PACK CARDIAC CATHETERIZATION (CUSTOM PROCEDURE TRAY) ×2 IMPLANT
SHEATH PROBE COVER 6X72 (BAG) ×1 IMPLANT
TRANSDUCER W/STOPCOCK (MISCELLANEOUS) ×2 IMPLANT
TUBING CIL FLEX 10 FLL-RA (TUBING) ×2 IMPLANT
WIRE HI TORQ VERSACORE-J 145CM (WIRE) ×1 IMPLANT

## 2021-11-16 NOTE — Discharge Instructions (Signed)
Radial Site Care  This sheet gives you information about how to care for yourself after your procedure. Your health care provider may also give you more specific instructions. If you have problems or questions, contact your health care provider. What can I expect after the procedure? After the procedure, it is common to have: Bruising and tenderness at the catheter insertion area. Follow these instructions at home: Medicines Take over-the-counter and prescription medicines only as told by your health care provider. Insertion site care Follow instructions from your health care provider about how to take care of your insertion site. Make sure you: Wash your hands with soap and water before you remove your bandage (dressing). If soap and water are not available, use hand sanitizer. May remove dressing in 24 hours. Check your insertion site every day for signs of infection. Check for: Redness, swelling, or pain. Fluid or blood. Pus or a bad smell. Warmth. Do no take baths, swim, or use a hot tub for 5 days. You may shower 24-48 hours after the procedure. Remove the dressing and gently wash the site with plain soap and water. Pat the area dry with a clean towel. Do not rub the site. That could cause bleeding. Do not apply powder or lotion to the site. Activity  For 24 hours after the procedure, or as directed by your health care provider: Do not flex or bend the affected arm. Do not push or pull heavy objects with the affected arm. Do not drive yourself home from the hospital or clinic. You may drive 24 hours after the procedure. Do not operate machinery or power tools. KEEP ARM ELEVATED THE REMAINDER OF THE DAY. Do not push, pull or lift anything that is heavier than 10 lb for 5 days. Ask your health care provider when it is okay to: Return to work or school. Resume usual physical activities or sports. Resume sexual activity. General instructions If the catheter site starts to  bleed, raise your arm and put firm pressure on the site. If the bleeding does not stop, get help right away. This is a medical emergency. DRINK PLENTY OF FLUIDS FOR THE NEXT 2-3 DAYS. No alcohol consumption for 24 hours after receiving sedation. If you went home on the same day as your procedure, a responsible adult should be with you for the first 24 hours after you arrive home. Keep all follow-up visits as told by your health care provider. This is important. Contact a health care provider if: You have a fever. You have redness, swelling, or yellow drainage around your insertion site. Get help right away if: You have unusual pain at the radial site. The catheter insertion area swells very fast. The insertion area is bleeding, and the bleeding does not stop when you hold steady pressure on the area. Your arm or hand becomes pale, cool, tingly, or numb. These symptoms may represent a serious problem that is an emergency. Do not wait to see if the symptoms will go away. Get medical help right away. Call your local emergency services (911 in the [place] Do not drive yourself to the hospital. Summary After the procedure, it is common to have bruising and tenderness at the site. Follow instructions from your health care provider about how to take care of your radial site wound. Check the wound every day for signs of infection.  This information is not intended to replace advice given to you by your health care provider. Make sure you discuss any questions you have with   your health care provider. Document Revised: 11/08/2017 Document Reviewed: 11/08/2017 Elsevier Patient Education  2020 Elsevier Inc.  

## 2021-11-16 NOTE — Interval H&P Note (Signed)
Cath Lab Visit (complete for each Cath Lab visit)  Clinical Evaluation Leading to the Procedure:   ACS: Yes.    Non-ACS:    Anginal Classification: CCS IV  Anti-ischemic medical therapy: Maximal Therapy (2 or more classes of medications)  Non-Invasive Test Results: No non-invasive testing performed  Prior CABG: No previous CABG    Known disease from prior caths   History and Physical Interval Note:  11/16/2021 3:37 PM  Arthur Sullivan  has presented today for surgery, with the diagnosis of unstable angina.  The various methods of treatment have been discussed with the patient and family. After consideration of risks, benefits and other options for treatment, the patient has consented to  Procedure(s): LEFT HEART CATH AND CORONARY ANGIOGRAPHY (N/A) as a surgical intervention.  The patient's history has been reviewed, patient examined, no change in status, stable for surgery.  I have reviewed the patient's chart and labs.  Questions were answered to the patient's satisfaction.     Lance Muss

## 2021-11-19 ENCOUNTER — Other Ambulatory Visit: Payer: Self-pay | Admitting: Cardiology

## 2021-11-23 ENCOUNTER — Other Ambulatory Visit: Payer: Self-pay

## 2021-11-23 ENCOUNTER — Ambulatory Visit (HOSPITAL_COMMUNITY)
Admission: RE | Admit: 2021-11-23 | Discharge: 2021-11-23 | Disposition: A | Discharge status: Home / Self Care | Payer: Medicare Other | Source: Ambulatory Visit | Attending: Cardiology | Admitting: Cardiology

## 2021-11-23 DIAGNOSIS — R0989 Other specified symptoms and signs involving the circulatory and respiratory systems: Secondary | ICD-10-CM | POA: Diagnosis not present

## 2021-11-25 ENCOUNTER — Encounter: Payer: Self-pay | Admitting: *Deleted

## 2021-12-10 ENCOUNTER — Ambulatory Visit (INDEPENDENT_AMBULATORY_CARE_PROVIDER_SITE_OTHER): Payer: Medicare Other | Admitting: Internal Medicine

## 2021-12-10 ENCOUNTER — Other Ambulatory Visit: Payer: Self-pay

## 2021-12-10 ENCOUNTER — Ambulatory Visit (HOSPITAL_COMMUNITY)
Admission: RE | Admit: 2021-12-10 | Discharge: 2021-12-10 | Disposition: A | Discharge status: Home / Self Care | Payer: Medicare Other | Source: Ambulatory Visit | Attending: Internal Medicine | Admitting: Internal Medicine

## 2021-12-10 ENCOUNTER — Encounter: Payer: Self-pay | Admitting: Internal Medicine

## 2021-12-10 DIAGNOSIS — F17210 Nicotine dependence, cigarettes, uncomplicated: Secondary | ICD-10-CM | POA: Diagnosis not present

## 2021-12-10 DIAGNOSIS — R070 Pain in throat: Secondary | ICD-10-CM

## 2021-12-10 DIAGNOSIS — R0609 Other forms of dyspnea: Secondary | ICD-10-CM

## 2021-12-10 DIAGNOSIS — G8929 Other chronic pain: Secondary | ICD-10-CM | POA: Diagnosis not present

## 2021-12-10 MED ORDER — FAMOTIDINE 20 MG PO TABS: ORAL_TABLET | ORAL | 11 refills | Status: DC

## 2021-12-10 NOTE — Patient Instructions (Signed)
The key is to stop smoking completely before smoking completely stops you!  Pantoprazole (protonix) 40 mg   Take  30-60 min before first meal of the day and Pepcid (famotidine)  20 mg after supper until return to office - this is the best way to tell whether stomach acid is contributing to your problem.    GERD (REFLUX)  is an extremely common cause of respiratory symptoms just like yours , many times with no obvious heartburn at all.    It can be treated with medication, but also with lifestyle changes including elevation of the head of your bed (ideally with 6-8inch blocks under the headboard of your bed),  Smoking cessation, avoidance of late meals, excessive alcohol, and avoid fatty foods, chocolate, peppermint, colas, red wine, and acidic juices such as orange juice.  NO MINT OR MENTHOL PRODUCTS SO NO COUGH DROPS  USE SUGARLESS CANDY INSTEAD (Jolley ranchers or Stover's or Life Savers) or even ice chips will also do - the key is to swallow to prevent all throat clearing. NO OIL BASED VITAMINS - use powdered substitutes.  Avoid fish oil when coughing.    Please remember to go to the  x-ray department  @  [facility] for your tests - we will call you with the results when they are available     We will walk you today to get a baseline  Return in about a month with all your medications

## 2021-12-10 NOTE — Assessment & Plan Note (Signed)
Counseled re importance of smoking cessation but did not meet time criteria for separate billing   °

## 2021-12-10 NOTE — Assessment & Plan Note (Addendum)
Onset ? 2020  initially felt to be angina equivalent  - DgEs  03/15/2019 Thickened folds in the region of the GE junction suggesting mild esophagitis. Minimal gastroesophageal reflux.  Nonspecific esophageal dysmotility.      Although he is an exceptionally challenging historian,  the resting component of the complaint in retrospect is  clearly not angina and is more likely to occur p supper assoc with dysphagia  typical of GERD, which apparently has also been dx in past, thus the rec to elevate the [facility] though by less than 2 inches is not adequate nor is the timing of PPI   rec Max rx for gerd with ppi bid ac and diet / elevate hob to 6-8 in and return in 4 weeks with all meds in hand using a trust but verify approach to confirm accurate Medication  Reconciliation The principal here is that until we are certain that the  patients are doing what we've asked, it makes no sense to ask them to do more.   Each maintenance medication was reviewed in detail including emphasizing most importantly the difference between maintenance and prns and under what circumstances the prns are to be triggered using an action plan format where appropriate.  Total time for H and P, chart review, counseling,  directly observing portions of ambulatory 02 saturation study/ and generating customized AVS unique to this office visit / same day charting > 45 min

## 2021-12-10 NOTE — Assessment & Plan Note (Signed)
Active smoker - PFTs 11/02/21 with minimal airflow obst and suggestion of VCD on insp loop with "throat pain on inhalation" during the study and no better p saba  - [facility] 11/16/21  No critical CAD,  lved nl  - 12/10/2021   Walked on RA  x  1  lap(s) =  approx 150  ft  @ slow pace, stopped due to leg pain = bilaterally  with lowest 02 sats 96%    although there may be copd present, it may not be clinically relevant:   it does not appear to be limiting activity tolerance any more than a set of worn tires limits someone from driving a car  around a parking lot.   That is to say:   this pt is so sedentary I don't recommend aggressive pulmonary rx at this point unless limiting symptoms arise or acute exacerbations become as issue, neither of which is the case now.

## 2021-12-10 NOTE — Progress Notes (Signed)
Arthur Sullivan, male    DOB: [birth year],    MRN: [id number]   Brief patient profile:  49  yowm active smoker  referred to pulmonary clinic in [place]  12/10/2021 by Dr Percival Spanish  for doe ? Onset with throat pain and doe since ?  March 2022 with [place] 11/16/21 with patent stents.  PFTs 11/02/21 with minimal airflow obst and suggestion of VCD on insp loop with "throat pain on inhalation" during the study and no better p saba      History of Present Illness  12/10/2021  Pulmonary/ 1st office eval/ [facility] / [facility] Office  Chief Complaint  Patient presents with   Consult    Consult for SOB after heart attacks.    Dyspnea:  limited by  pain and weakness both legs - says due to neuropathy  Cough: none Sleep: on blocks x 2 in / one pillow SABA use:  Throat pain worse p supper,present at rest as well as exertion   No obvious day to day or daytime variability or assoc excess/ purulent sputum or mucus plugs or hemoptysis or cp or chest tightness, subjective wheeze or overt sinus or hb symptoms.   Sleeping as above  without nocturnal  or early am exacerbation  of respiratory  c/o's or need for noct saba. Also denies any obvious fluctuation of symptoms with weather or environmental changes or other aggravating or alleviating factors except as outlined above   No unusual exposure hx or h/o childhood pna/ asthma or knowledge of premature birth.  Current Allergies, Complete Past Medical History, Past Surgical History, Family History, and Social History were reviewed in [employer] record.  ROS  The following are not active complaints unless bolded Hoarseness, sore throat, dysphagia, dental problems, itching, sneezing,  nasal congestion or discharge of excess mucus or purulent secretions, ear ache,   fever, chills, sweats, unintended wt loss or wt gain, classically pleuritic or exertional cp,  orthopnea pnd or arm/hand swelling  or leg swelling, presyncope, palpitations,  abdominal pain, anorexia, nausea, vomiting, diarrhea  or change in bowel habits or change in bladder habits, change in stools or change in urine, dysuria, hematuria,  rash, arthralgias, visual complaints, headache, numbness, weakness or ataxia or problems with walking or coordination,  change in mood or  memory.           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   COPD (chronic obstructive pulmonary disease) (HCC)    Dyslipidemia    Hiatal hernia    Hyperlipidemia    Kidney stones    Myocardial infarction [facility])    Peyronie's disease     Outpatient Medications Prior to Visit  Medication Sig Dispense Refill   albuterol (VENTOLIN HFA) 108 (90 Base) MCG/ACT inhaler Inhale into the lungs every 4 (four) hours as needed for wheezing or shortness of breath.     aspirin EC 81 MG tablet Take 81 mg by mouth daily. Swallow whole.     atorvastatin (LIPITOR) 40 MG tablet Take 40 mg by mouth daily.     brimonidine (ALPHAGAN P) 0.1 % SOLN      clopidogrel (PLAVIX) 75 MG tablet Take 75 mg by mouth daily.     fluticasone (FLONASE) 50 MCG/ACT nasal spray Place 2 sprays into both nostrils daily.     furosemide (LASIX) 20 MG tablet  Take 10 mg by mouth.     latanoprost (XALATAN) 0.005 % ophthalmic solution 1 drop at bedtime.     LORazepam (ATIVAN) 0.5 MG tablet Take 0.5 mg by mouth every 8 (eight) hours.     metoprolol tartrate (LOPRESSOR) 25 MG tablet Take 12.5 mg by mouth 2 (two) times daily. 1/2 of a 25 mg tab twice a day     pantoprazole (PROTONIX) 40 MG tablet Take 40 mg by mouth daily.     timolol (TIMOPTIC) 0.5 % ophthalmic solution 1 drop 2 (two) times daily.     amlodipine-atorvastatin (CADUET) 10-10 MG tablet Take 1 tablet by mouth daily.     oxyCODONE-acetaminophen (PERCOCET) 10-325 MG tablet Take 1 tablet by mouth every 4 (four) hours as needed for pain.     albuterol (VENTOLIN HFA) 108  (90 Base) MCG/ACT inhaler Inhale 2 puffs into the lungs every 4 (four) hours as needed for shortness of breath.     ALPHAGAN P 0.1 % SOLN Place 1 drop into both eyes 2 (two) times daily.     aspirin EC 81 MG tablet Take 81 mg by mouth daily. Swallow whole.     atorvastatin (LIPITOR) 40 MG tablet Take 40 mg by mouth daily.     clopidogrel (PLAVIX) 75 MG tablet Take 75 mg by mouth daily.     fluticasone (FLONASE) 50 MCG/ACT nasal spray Place 2 sprays into both nostrils at bedtime.     furosemide (LASIX) 20 MG tablet Take 10 mg by mouth daily as needed for edema.     latanoprost (XALATAN) 0.005 % ophthalmic solution Place 1 drop into both eyes at bedtime.     LORazepam (ATIVAN) 0.5 MG tablet Take 0.5 mg by mouth 3 (three) times daily.     metoprolol tartrate (LOPRESSOR) 25 MG tablet TAKE 1 TABLET BY MOUTH TWICE DAILY 180 tablet 1   pantoprazole (PROTONIX) 40 MG tablet Take 40 mg by mouth daily.     ranolazine (RANEXA) 500 MG 12 hr tablet Take 1 tablet (500 mg total) by mouth 2 (two) times daily. 60 tablet 11   ranolazine (RANEXA) 500 MG 12 hr tablet Take 500 mg by mouth 2 (two) times daily.     tamsulosin (FLOMAX) 0.4 MG CAPS capsule Take 0.4 mg by mouth.     timolol (TIMOPTIC) 0.5 % ophthalmic solution Place 1 drop into both eyes 2 (two) times daily.     warfarin (COUMADIN) 2 MG tablet Take 2 mg by mouth daily.     No facility-administered medications prior to visit.     Objective:     BP 130/84 (BP Location: Left Arm)    Pulse 74    Temp 98 F (36.7 C)    Ht 5\' 5"  (1.651 m)    Wt 126 lb 9.6 oz (57.4 kg)    SpO2 98%    BMI 21.07 kg/m   SpO2: 98 %  Ambulatory thin wm exceptionally difficult historian with very poor insight into symptoms, names of meds   HEENT : pt wearing mask not removed for exam due to covid - 19 concerns.   NECK :  without JVD/Nodes/TM/ nl carotid upstrokes bilaterally   LUNGS: no acc muscle use,  Min barrel  contour chest wall with bilateral  slightly decreased  bs s audible wheeze and  without cough on insp or exp maneuvers and min  Hyperresonant  to  percussion bilaterally     CV:  RRR  no s3 or murmur or increase  in P2, and no edema   ABD:  soft and nontender with pos end  insp Hoover's  in the supine position. No bruits or organomegaly appreciated, bowel sounds nl  MS:   Nl gait/  ext warm without deformities, calf tenderness, cyanosis or clubbing No obvious joint restrictions   SKIN: warm and dry without lesions    NEURO:  alert, approp, nl sensorium with  no motor or cerebellar deficits apparent.   CXR PA and Lateral:   12/10/2021 :    I personally reviewed images and impression is as follows:        Mild / mod copd    Assessment   DOE (dyspnea on exertion) Active smoker - PFTs 11/02/21 with minimal airflow obst and suggestion of VCD on insp loop with "throat pain on inhalation" during the study and no better p saba  - [facility] 11/16/21  No critical CAD,  lved nl  - 12/10/2021   Walked on RA  x  1  lap(s) =  approx 150  ft  @ slow pace, stopped due to leg pain = bilaterally  with lowest 02 sats 96%    although there may be copd present, it may not be clinically relevant:   it does not appear to be limiting activity tolerance any more than a set of worn tires limits someone from driving a car  around a parking lot.   That is to say:   this pt is so sedentary I don't recommend aggressive pulmonary rx at this point unless limiting symptoms arise or acute exacerbations become as issue, neither of which is the case now.     Chronic throat pain Onset ? 2020  initially felt to be angina equivalent  - DgEs  03/15/2019 Thickened folds in the region of the GE junction suggesting mild esophagitis. Minimal gastroesophageal reflux.  Nonspecific esophageal dysmotility.    Although he is an exceptionally challenging historian,  the resting component of the complaint in retrospect is  clearly not angina and is more likely to occur p supper assoc with dysphagia   typical of GERD, which apparently has also been dx in past, thus the rec to elevate the [facility] though by less than 2 inches is not adequate nor is the timing of PPI   rec Max rx for gerd with ppi bid ac and diet / elevate hob to 6-8 in and return in 4 weeks with all meds in hand using a trust but verify approach to confirm accurate Medication  Reconciliation The principal here is that until we are certain that the  patients are doing what we've asked, it makes no sense to ask them to do more.     Cigarette smoker Counseled re importance of smoking cessation but did not meet time criteria for separate billing     Each maintenance medication was reviewed in detail including emphasizing most importantly the difference between maintenance and prns and under what circumstances the prns are to be triggered using an action plan format where appropriate.  Total time for H and P, chart review, counseling,  directly observing portions of ambulatory 02 saturation study/ and generating customized AVS unique to this office visit / same day charting > 45 min         Christinia Gully, MD 12/10/2021

## 2022-01-20 ENCOUNTER — Ambulatory Visit: Payer: Medicare Other | Admitting: Internal Medicine

## 2022-01-20 NOTE — Progress Notes (Deleted)
? ?Arthur Sullivan, male    DOB: 1946/06/11     MRN: [id number] ? ? ?Brief patient profile:  ?2  yowm active smoker  referred to pulmonary clinic in [place]  12/10/2021 by Dr Arthur Sullivan  for doe ? Onset with throat pain and doe since ?  March 2022 with [place] 11/16/21 with patent stents. ? ?PFTs 11/02/21 with minimal airflow obst and suggestion of VCD on insp loop with "throat pain on inhalation" during the study and no better p saba  ? ? ? ? ?History of Present Illness  ?12/10/2021  Pulmonary/ 1st office eval/ Arthur Sullivan / [facility]  ?Chief Complaint  ?Patient presents with  ? Consult  ?  Consult for SOB after heart attacks.  ?  ?Dyspnea:  limited by  pain and weakness both legs - says due to neuropathy  ?Cough: none ?Sleep: on blocks x 2 in / one pillow ?SABA use:  ?Throat pain worse p supper,present at rest as well as exertion  ?Rec ?The key is to stop smoking completely before smoking completely stops you! ?Pantoprazole (protonix) 40 mg   Take  30-60 min before first meal of the day and Pepcid (famotidine)  20 mg after supper until return to office   ?GERD diet reviewed, bed blocks rec  ?Please remember to go to the  x-ray department  @  [facility] for your tests - we will call you with the results when they are available    ?Return in about a month with all your medications ? ?  ? ?01/20/2022  f/u ov/[place] office/Arthur Sullivan re: *** maint on ***  ?No chief complaint on file. ?  ?Dyspnea:  *** ?Cough: *** ?Sleeping: *** ?SABA use: *** ?02: *** ?Covid status: *** ?Lung cancer screening: *** ? ? ?No obvious day to day or daytime variability or assoc excess/ purulent sputum or mucus plugs or hemoptysis or cp or chest tightness, subjective wheeze or overt sinus or hb symptoms.  ? ?*** without nocturnal  or early am exacerbation  of respiratory  c/o's or need for noct saba. Also denies any obvious fluctuation of symptoms with weather or environmental changes or other aggravating or alleviating factors except as  outlined above  ? ?No unusual exposure hx or h/o childhood pna/ asthma or knowledge of premature birth. ? ?Current Allergies, Complete Past Medical History, Past Surgical History, Family History, and Social History were reviewed in [employer] record. ? ?ROS  The following are not active complaints unless bolded ?Hoarseness, sore throat, dysphagia, dental problems, itching, sneezing,  nasal congestion or discharge of excess mucus or purulent secretions, ear ache,   fever, chills, sweats, unintended wt loss or wt gain, classically pleuritic or exertional cp,  orthopnea pnd or arm/hand swelling  or leg swelling, presyncope, palpitations, abdominal pain, anorexia, nausea, vomiting, diarrhea  or change in bowel habits or change in bladder habits, change in stools or change in urine, dysuria, hematuria,  rash, arthralgias, visual complaints, headache, numbness, weakness or ataxia or problems with walking or coordination,  change in mood or  memory. ?      ? ?No outpatient medications have been marked as taking for the 01/20/22 encounter (Appointment) with Arthur Rockers, MD.  ?     ?  ?  ?      ?   ? ?Past Medical History:  ?Diagnosis Date  ? Anxiety   ? Benign positional vertigo   ? Cardiac arrest [facility])   ? History of this  with a report of 5 stents.  ? COPD (chronic obstructive pulmonary disease) ([place])   ? Dyslipidemia   ? Hiatal hernia   ? Hyperlipidemia   ? Kidney stones   ? Myocardial infarction [facility])   ? Peyronie's disease   ? ?  ? ? ? ?Objective:  ?  ?Wt Readings from Last 3 Encounters:  ?12/10/21 126 lb 9.6 oz (57.4 kg)  ?11/16/21 125 lb (56.7 kg)  ?11/03/21 127 lb 3.2 oz (57.7 kg)  ?  ? ? ?Vital signs reviewed  01/20/2022  - Note at rest 02 sats  ***% on ***  ? ?General appearance:    ***  ? ? ? Min bar *** ? ? ?I personally reviewed images and agree with radiology impression as follows:  ?CXR:   pa and lateral  12/10/21 ?1. Smoothly marginated opacity abutting the lower left heart  border ?likely represents a prominent epicardial fat pad, but is nonspecific ?in the absence of prior exams to establish stability. Consider ?short-term interval follow-up exam in 1-2 months or further ?assessment with chest CT, as clinically indicated. ?2. Hyperinflation, often seen in the setting of COPD. ?   ?Assessment  ? ?  ? ? ?  ? ? ?  ?

## 2022-01-30 DIAGNOSIS — Z720 Tobacco use: Secondary | ICD-10-CM | POA: Insufficient documentation

## 2022-01-30 NOTE — Progress Notes (Signed)
?  ?Cardiology Office Note ? ? ?Date:  02/02/2022  ? ?ID:  Arthur Sullivan, DOB 26-Oct-1945, MRN [id number] ? ?PCP:  Veverly Fells, MD  ?Cardiologist:   Rollene Rotunda, MD ? ? ?Chief Complaint  ?Patient presents with  ? Coronary Artery Disease  ? ? ? ?  ?History of Present Illness: ?Arthur Sullivan is a 76 y.o. male who presents for evaluation of CAD.  He said in 2001 he had 4 stents at [facility]  In 2004 he had 1 stent.  In March 2022 he was at [facility].  He ruled in for NSTEMI initially but left AGAINST MEDICAL ADVICE.  He had recurrent chest discomfort and came in and was found to have LAD 20 to 30% stenosis, left circumflex 99% stenosis and a previous stent and it was not thought to be amenable to PCI.  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  In early April at [facility] catheterization demonstrated that the RCA was patent.  At [facility] it looks like they were not able to do a balloon angioplasty but could not pass a stent.  This was described as a tortuous origin with calcification of this OM.  No stent was deployed.  They suggested that no further attempts at the OM and revascularize should be attempted because of the poor target.  I sent him for evaluation of SOB and he has moderately severe airway obstruction.  I sent him for ABIs for foot pain and these were normal.   ? ?At the last visit he had a cardiac cath with results as below.  He was managed medically.  He is still getting throat pain when he tries to do any activities.  He was told by Dr. Sherene Sires pulmonary that he might have reflux.  He is actually going to see GI.  He is limited in doing activities when he gets his heart rate up because of the throat discomfort.  He denies any resting symptoms.  He is not having any new shortness of breath, PND or orthopnea.  Has had no palpitations, presyncope or syncope. ? ? ?Past Medical History:  ?Diagnosis Date  ? Anxiety   ? Benign positional vertigo   ? Cardiac arrest [facility])    ? History of this with a report of 5 stents.  ? COPD (chronic obstructive pulmonary disease) (HCC)   ? Dyslipidemia   ? Hiatal hernia   ? Hyperlipidemia   ? Kidney stones   ? Myocardial infarction [facility])   ? Peyronie's disease   ? ? ?Past Surgical History:  ?Procedure Laterality Date  ? BACK SURGERY    ? LEFT HEART CATH AND CORONARY ANGIOGRAPHY N/A 11/16/2021  ? Procedure: LEFT HEART CATH AND CORONARY ANGIOGRAPHY;  Surgeon: Corky Crafts, MD;  Location: [facility] INVASIVE CV LAB;  Service: Cardiovascular;  Laterality: N/A;  ? NOSE SURGERY    ? TONSILLECTOMY    ? ? ? ?Current Outpatient Medications  ?Medication Sig Dispense Refill  ? albuterol (VENTOLIN HFA) 108 (90 Base) MCG/ACT inhaler Inhale into the lungs every 4 (four) hours as needed for wheezing or shortness of breath.    ? aspirin EC 81 MG tablet Take 81 mg by mouth daily. Swallow whole.    ? atorvastatin (LIPITOR) 40 MG tablet Take 40 mg by mouth daily.    ? brimonidine (ALPHAGAN P) 0.1 % SOLN     ? clopidogrel (PLAVIX) 75 MG tablet Take 75 mg by mouth daily.    ?  fluticasone (FLONASE) 50 MCG/ACT nasal spray Place 2 sprays into both nostrils daily.    ? furosemide (LASIX) 20 MG tablet Take 10 mg by mouth.    ? latanoprost (XALATAN) 0.005 % ophthalmic solution 1 drop at bedtime.    ? LORazepam (ATIVAN) 0.5 MG tablet Take 0.5 mg by mouth every 8 (eight) hours.    ? metoprolol tartrate (LOPRESSOR) 25 MG tablet Take 12.5 mg by mouth 2 (two) times daily. 1/2 of a 25 mg tab twice a day    ? pantoprazole (PROTONIX) 40 MG tablet Take 40 mg by mouth daily.    ? ranolazine (RANEXA) 500 MG 12 hr tablet Take 1 tablet (500 mg total) by mouth 2 (two) times daily. 180 tablet 3  ? timolol (TIMOPTIC) 0.5 % ophthalmic solution 1 drop 2 (two) times daily.    ? famotidine (PEPCID) 20 MG tablet One after supper (Patient not taking: Reported on 02/02/2022) 30 tablet 11  ? ?No current facility-administered medications for this visit.  ? ? ?Allergies:   Diphenhydramine,  Erythromycin, Morphine and related, Nitroglycerin, Azithromycin, Dilaudid [hydromorphone], Erythromycin base, Isosorbide dinitrate, Levofloxacin, Morphine, and Cyclobenzaprine  ? ?ROS:  Please see the history of present illness.   Otherwise, review of systems are positive for none.   All other systems are reviewed and negative.  ? ? ?PHYSICAL EXAM: ?VS:  BP 122/60   Pulse (!) 56   Ht 5\' 5"  (1.651 m)   Wt 127 lb (57.6 kg)   BMI 21.13 kg/m?  , BMI Body mass index is 21.13 kg/m?. ?GENERAL:  Well appearing ?NECK:  No jugular venous distention, waveform within normal limits, carotid upstroke brisk and symmetric, no bruits, no thyromegaly ?LUNGS:  Clear to auscultation bilaterally ?CHEST:  Unremarkable ?HEART:  PMI not displaced or sustained,S1 and S2 within normal limits, no S3, no S4, no clicks, no rubs, 2 out of 6 apical systolic murmur radiating slightly at aortic outflow tract, no diastolic murmurs ?ABD:  Flat, positive bowel sounds normal in frequency in pitch, no bruits, no rebound, no guarding, no midline pulsatile mass, no hepatomegaly, no splenomegaly ?EXT:  2 plus pulses upper, diminished dorsalis pedis and posttibial's bilateral, no edema, no cyanosis no clubbing ? ? ? ?EKG:  EKG is  ordered today. ?The ekg ordered today demonstrates sinus rhythm, rate 56 axis within normal limits, intervals within normal limits, no acute ST-T wave changes. ? ? ?Recent Labs: ?11/12/2021: BUN 15; Creatinine, Ser 0.68; Hemoglobin 12.0; Platelets 319; Potassium 4.8; Sodium 142  ? ? ?Lipid Panel ?No results found for: CHOL, TRIG, HDL, CHOLHDL, VLDL, LDLCALC, LDLDIRECT ?  ? ?Wt Readings from Last 3 Encounters:  ?02/02/22 127 lb (57.6 kg)  ?12/10/21 126 lb 9.6 oz (57.4 kg)  ?11/16/21 125 lb (56.7 kg)  ?  ? ?CARDIAC CATH: ? ?Diagnostic ?Dominance: Right ? ? ? ?Other studies Reviewed: ?Additional studies/ records that were reviewed today include: Labs ?Review of the above records demonstrates:  Please see elsewhere in the note.    ? ? ?ASSESSMENT AND PLAN: ? ?THROAT PAIN:    This could be reflux.  Also still could be  his pain is consistent with unstable angina.  He has not tolerated Imdur.  I did start him on Ranexa in February twice daily.  For some reason this is not on his list and he does not remember who stopped this.  However, we do not see any indication for stopping it contraindication to continuing it and so we will try it again. ? ?  SOB:    He has seen pulmonary.  He continues to smoke cigarettes.  No further work-up. ? ?TOBACCO ABUSE:   We have talked about this at each visit. ? ?CAD: This will be managed as above. ? ?CAROTID BRUITS:    He had internal carotid 50 to 69% stenosis.  He needs this followed in 1 year.   ? ?ANEMIA:   Hemoglobin was stable and mildly low in January. ? ?AAA SCREENING: Because he is a smoker above the age of 89 we will check an abdominal ultrasound. ? ?AI: He had this mention previously on an echocardiogram at an outside facility and I will obtain a follow-up echo. ? ?Current medicines are reviewed at length with the patient today.  The patient does not have concerns regarding medicines. ? ?The following changes have been made: Ranexa restarted ? ?Labs/ tests ordered today include:     ? ?Orders Placed This Encounter  ?Procedures  ? US AORTA MEDICARE SCREENING  ? EKG 12-Lead  ? ECHOCARDIOGRAM COMPLETE  ? ? ? ? ?Disposition:   Follow-up with me in about 3 months ? ? ?Signed, ?Rollene Rotunda, MD  ?02/02/2022 10:33 AM    ?[facility] ? ? ? ?

## 2022-02-02 ENCOUNTER — Ambulatory Visit (INDEPENDENT_AMBULATORY_CARE_PROVIDER_SITE_OTHER): Payer: Medicare Other | Admitting: Cardiology

## 2022-02-02 ENCOUNTER — Encounter: Payer: Self-pay | Admitting: Cardiology

## 2022-02-02 VITALS — BP 122/60 | HR 56 | Ht 65.0 in | Wt 127.0 lb

## 2022-02-02 DIAGNOSIS — I350 Nonrheumatic aortic (valve) stenosis: Secondary | ICD-10-CM

## 2022-02-02 DIAGNOSIS — M79673 Pain in unspecified foot: Secondary | ICD-10-CM | POA: Diagnosis not present

## 2022-02-02 DIAGNOSIS — Z720 Tobacco use: Secondary | ICD-10-CM

## 2022-02-02 DIAGNOSIS — I2510 Atherosclerotic heart disease of native coronary artery without angina pectoris: Secondary | ICD-10-CM

## 2022-02-02 MED ORDER — RANOLAZINE ER 500 MG PO TB12
500.0000 mg | ORAL_TABLET | Freq: Two times a day (BID) | ORAL | 3 refills | Status: DC
Start: 2022-02-02 — End: 2022-04-27

## 2022-02-02 NOTE — Patient Instructions (Signed)
Medication Instructions:  ?Please start Ranexa 500 mg twice daily. ?Continue all other medications as listed. ? ?*If you need a refill on your cardiac medications before your next appointment, please call your pharmacy* ? ?Testing/Procedures: ?Your physician has requested that you have an echocardiogram at [facility]. Echocardiography is a painless test that uses sound waves to create images of your heart. It provides your doctor with information about the size and shape of your heart and how well your heart?s chambers and valves are working. This procedure takes approximately one hour. There are no restrictions for this procedure. ? ?Your physician has requested that you have an abdominal aorta duplex. During this test, an ultrasound is used to evaluate the aorta. Allow 30 minutes for this exam. Do not eat after midnight the day before and avoid carbonated beverages ? ?You will be contacted to be scheduled for the above testing. ? ? ?Follow-Up: ?At [facility], you and your health needs are our priority.  As part of our continuing mission to provide you with exceptional heart care, we have created designated Provider Care Teams.  These Care Teams include your primary Cardiologist (physician) and Advanced Practice Providers (APPs -  Physician Assistants and Nurse Practitioners) who all work together to provide you with the care you need, when you need it. ? ?We recommend signing up for the patient portal called "MyChart".  Sign up information is provided on this After Visit Summary.  MyChart is used to connect with patients for Virtual Visits (Telemedicine).  Patients are able to view lab/test results, encounter notes, upcoming appointments, etc.  Non-urgent messages can be sent to your provider as well.   ?To learn more about what you can do with MyChart, go to [email or web address].   ? ?Your next appointment:   ?3 month(s) ? ?The format for your next appointment:   ?In Person ? ?Provider:   ?Rollene Rotunda,  MD  ? ? ?Important Information About Sugar ? ? ? ? ?  ?

## 2022-02-15 ENCOUNTER — Other Ambulatory Visit: Payer: Self-pay | Admitting: Cardiology

## 2022-02-17 ENCOUNTER — Ambulatory Visit (HOSPITAL_COMMUNITY): Payer: Medicare Other

## 2022-02-24 ENCOUNTER — Ambulatory Visit (HOSPITAL_COMMUNITY)
Admission: RE | Admit: 2022-02-24 | Discharge: 2022-02-24 | Disposition: A | Discharge status: Home / Self Care | Payer: Medicare Other | Source: Ambulatory Visit | Attending: Cardiology | Admitting: Cardiology

## 2022-02-24 DIAGNOSIS — Z136 Encounter for screening for cardiovascular disorders: Secondary | ICD-10-CM | POA: Insufficient documentation

## 2022-02-24 DIAGNOSIS — I350 Nonrheumatic aortic (valve) stenosis: Secondary | ICD-10-CM | POA: Diagnosis present

## 2022-02-24 DIAGNOSIS — J449 Chronic obstructive pulmonary disease, unspecified: Secondary | ICD-10-CM | POA: Diagnosis not present

## 2022-02-24 DIAGNOSIS — E785 Hyperlipidemia, unspecified: Secondary | ICD-10-CM | POA: Diagnosis not present

## 2022-02-24 DIAGNOSIS — I080 Rheumatic disorders of both mitral and aortic valves: Secondary | ICD-10-CM | POA: Diagnosis not present

## 2022-02-24 DIAGNOSIS — I252 Old myocardial infarction: Secondary | ICD-10-CM | POA: Diagnosis not present

## 2022-02-24 DIAGNOSIS — Z720 Tobacco use: Secondary | ICD-10-CM | POA: Insufficient documentation

## 2022-02-24 DIAGNOSIS — I2510 Atherosclerotic heart disease of native coronary artery without angina pectoris: Secondary | ICD-10-CM

## 2022-02-24 DIAGNOSIS — F17210 Nicotine dependence, cigarettes, uncomplicated: Secondary | ICD-10-CM | POA: Diagnosis not present

## 2022-02-24 LAB — ECHOCARDIOGRAM COMPLETE
AR max vel: 0.74 cm2
AV Area VTI: 0.74 cm2
AV Area mean vel: 0.74 cm2
AV Mean grad: 15 mmHg
AV Peak grad: 28.9 mmHg
Ao pk vel: 2.69 m/s
Area-P 1/2: 3.53 cm2
P 1/2 time: 676 msec
S' Lateral: 3.1 cm

## 2022-02-24 NOTE — Progress Notes (Signed)
*  PRELIMINARY RESULTS* ?Echocardiogram ?2D Echocardiogram has been performed. ? ?Arthur Sullivan ?02/24/2022, 12:24 PM ?

## 2022-04-21 ENCOUNTER — Telehealth: Payer: Self-pay | Admitting: Cardiology

## 2022-04-21 ENCOUNTER — Other Ambulatory Visit: Payer: Self-pay | Admitting: Cardiology

## 2022-04-21 MED ORDER — CLOPIDOGREL BISULFATE 75 MG PO TABS: 75.0000 mg | ORAL_TABLET | Freq: Every day | ORAL | 2 refills | Status: DC

## 2022-04-21 NOTE — Telephone Encounter (Signed)
*  STAT* If patient is at the pharmacy, call can be transferred to refill team.   1. Which medications need to be refilled? (please list name of each medication and dose if known) clopidogrel (PLAVIX) 75 MG tablet  2. Which pharmacy/location (including street and city if local pharmacy) is medication to be sent to? [facility] [place],  - [address]  3. Do they need a 30 day or 90 day supply? 90  Pt states this used to be filled by another provider he no longer sees and would like to have it filled by Dr. Antoine Poche.

## 2022-04-25 DIAGNOSIS — R0989 Other specified symptoms and signs involving the circulatory and respiratory systems: Secondary | ICD-10-CM | POA: Insufficient documentation

## 2022-04-25 NOTE — Progress Notes (Unsigned)
Cardiology Office Note   Date:  04/27/2022   ID:  Wissam, Resor 1945/11/28, MRN [id number]  PCP:  Veverly Fells, MD  Cardiologist:   Rollene Rotunda, MD   Chief Complaint  Patient presents with   Throat Pain       History of Present Illness: Arthur Sullivan is a 76 y.o. male who presents for evaluation of CAD.  He said in 2001 he had 4 stents at [facility].  In 2004 he had 1 stent.  In March 2022 he was at [facility].  He ruled in for NSTEMI initially but left AGAINST MEDICAL ADVICE.  He had recurrent chest discomfort and came in and was found to have LAD 20 to 30% stenosis, left circumflex 99% stenosis and a previous stent and it was not thought to be amenable to PCI.  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  In early April at [facility] catheterization demonstrated that the RCA was patent.  At [facility] it looks like they were not able to do a balloon angioplasty but could not pass a stent.  This was described as a tortuous origin with calcification of this OM.  No stent was deployed.  They suggested that no further attempts at the OM and revascularize should be attempted because of the poor target.  I sent him for evaluation of SOB and he has moderately severe airway obstruction.  I sent him for ABIs for foot pain and these were normal.    Cardic cath in Jan 2023 demonstrated results as below.  This was done because of pain in his throat with actiity.    He was managed medically.   He is being managed for possible reflux.  At the last visit we revisited why he was not taking Ranexa that was prescribed and I represcribed it.  However, it still does not seem like he is taking it.  He still has some throat pain and he is seeing and being treated by GI.  He said yesterday he had a coughing spell.  He has had some numbness that he points to the right side of his chest from anterior radiating around to his back.  It radiates a little bit down to his leg  and a little bit to his right shoulder.  This came on yesterday.  His wife checks blood pressure and heart rate and they were fine.  He says it is slowly resolving.  He did not report visual changes motor or speech changes.   Past Medical History:  Diagnosis Date   Anxiety    Benign positional vertigo    Cardiac arrest [facility]    History of this with a report of 5 stents.   COPD (chronic obstructive pulmonary disease) (HCC)    Dyslipidemia    Hiatal hernia    Hyperlipidemia    Kidney stones    Myocardial infarction [facility])    Peyronie's disease     Past Surgical History:  Procedure Laterality Date   BACK SURGERY     LEFT HEART CATH AND CORONARY ANGIOGRAPHY N/A 11/16/2021   Procedure: LEFT HEART CATH AND CORONARY ANGIOGRAPHY;  Surgeon: Corky Crafts, MD;  Location: [facility] INVASIVE CV LAB;  Service: Cardiovascular;  Laterality: N/A;   NOSE SURGERY     TONSILLECTOMY       Current Outpatient Medications  Medication Sig Dispense Refill   albuterol (VENTOLIN HFA) 108 (90 Base) MCG/ACT inhaler Inhale into the lungs every  4 (four) hours as needed for wheezing or shortness of breath.     aspirin EC 81 MG tablet Take 81 mg by mouth daily. Swallow whole.     atorvastatin (LIPITOR) 40 MG tablet Take 40 mg by mouth daily.     brimonidine (ALPHAGAN P) 0.1 % SOLN      clopidogrel (PLAVIX) 75 MG tablet Take 1 tablet (75 mg total) by mouth daily. 90 tablet 2   dexlansoprazole (DEXILANT) 60 MG capsule Take 60 mg by mouth daily.     famotidine (PEPCID) 20 MG tablet One after supper 30 tablet 11   fluticasone (FLONASE) 50 MCG/ACT nasal spray Place 2 sprays into both nostrils daily.     furosemide (LASIX) 20 MG tablet Take 10 mg by mouth daily as needed.     latanoprost (XALATAN) 0.005 % ophthalmic solution 1 drop at bedtime.     LORazepam (ATIVAN) 0.5 MG tablet Take 0.5 mg by mouth every 8 (eight) hours.     metoprolol tartrate (LOPRESSOR) 25 MG tablet TAKE 1 TABLET BY MOUTH TWICE DAILY 180 tablet  0   timolol (TIMOPTIC) 0.5 % ophthalmic solution 1 drop 2 (two) times daily.     No current facility-administered medications for this visit.    Allergies:   Diphenhydramine, Erythromycin, Morphine and related, Nitroglycerin, Azithromycin, Dilaudid [hydromorphone], Erythromycin base, Isosorbide dinitrate, Levofloxacin, Morphine, and Cyclobenzaprine   ROS:  Please see the history of present illness.   Otherwise, review of systems are positive for none.   All other systems are reviewed and negative.    PHYSICAL EXAM: VS:  BP 130/68   Pulse 60   Ht 5\' 5"  (1.651 m)   Wt 127 lb (57.6 kg)   BMI 21.13 kg/m  , BMI Body mass index is 21.13 kg/m. GENERAL:  Well appearing NECK:  No jugular venous distention, waveform within normal limits, carotid upstroke brisk and symmetric, no bruits, no thyromegaly LUNGS:  Clear to auscultation bilaterally CHEST:  Unremarkable HEART:  PMI not displaced or sustained,S1 and S2 within normal limits, no S3, no S4, no clicks, no rubs, 2 out of 6 apical systolic murmur radiating at the aortic outflow tract into the carotids, no diastolic murmurs ABD:  Flat, positive bowel sounds normal in frequency in pitch, no bruits, no rebound, no guarding, no midline pulsatile mass, no hepatomegaly, no splenomegaly EXT:  2 plus pulses throughout, no edema, no cyanosis no clubbing   EKG:  EKG is not ordered today. The ekg ordered today demonstrates sinus rhythm, rate 56 axis within normal limits, intervals within normal limits, no acute ST-T wave changes.  02/03/2022   Recent Labs: 11/12/2021: BUN 15; Creatinine, Ser 0.68; Hemoglobin 12.0; Platelets 319; Potassium 4.8; Sodium 142    Lipid Panel No results found for: "CHOL", "TRIG", "HDL", "CHOLHDL", "VLDL", "LDLCALC", "LDLDIRECT"    Wt Readings from Last 3 Encounters:  04/27/22 127 lb (57.6 kg)  02/02/22 127 lb (57.6 kg)  12/10/21 126 lb 9.6 oz (57.4 kg)     CARDIAC CATH:  Diagnostic Dominance: Right    Other  studies Reviewed: Additional studies/ records that were reviewed today include: Echocardiogram Review of the above records demonstrates:  Please see elsewhere in the note.     ASSESSMENT AND PLAN:  THROAT PAIN:    I encouraged continued follow-up with GI.  He does not want to take Ranexa and so we will take this off his med list.  I do not see a contraindication but again he is not willing  to restart it.  SOB:   He has baseline shortness of breath.  However, with a cough yesterday looked back at the chest x-ray from February which suggested inflammation and suggested follow-up study and so I will send him for this.   TOBACCO ABUSE: He has no intention of quitting smoking.  CAD: He is good to be managed with risk reduction.  No further intervention.   CAROTID BRUITS:    He had internal carotid 50 to 69% stenosis.  He needs this followed in Jan 2024.       ANEMIA:   Hemoglobin was 12.0 in January.  No change in therapy.   AAA SCREENING:   He was screened  and there is no evidence of an aneurysm.    AI:  This was mild on echo in May.  I will follow this clinically.  Current medicines are reviewed at length with the patient today.  The patient does not have concerns regarding medicines.  The following changes have been made:  None  Labs/ tests ordered today include:      Orders Placed This Encounter  Procedures   DG Chest 2 View      Disposition:   Follow-up with me in about 12 months   Signed, Rollene Rotunda, MD  04/27/2022 10:23 AM    [facility]

## 2022-04-27 ENCOUNTER — Encounter: Payer: Self-pay | Admitting: Cardiology

## 2022-04-27 ENCOUNTER — Ambulatory Visit (INDEPENDENT_AMBULATORY_CARE_PROVIDER_SITE_OTHER): Payer: Medicare Other | Admitting: Cardiology

## 2022-04-27 VITALS — BP 130/68 | HR 60 | Ht 65.0 in | Wt 127.0 lb

## 2022-04-27 DIAGNOSIS — R9389 Abnormal findings on diagnostic imaging of other specified body structures: Secondary | ICD-10-CM

## 2022-04-27 DIAGNOSIS — G8929 Other chronic pain: Secondary | ICD-10-CM

## 2022-04-27 DIAGNOSIS — R0989 Other specified symptoms and signs involving the circulatory and respiratory systems: Secondary | ICD-10-CM

## 2022-04-27 DIAGNOSIS — R0602 Shortness of breath: Secondary | ICD-10-CM | POA: Diagnosis not present

## 2022-04-27 DIAGNOSIS — R070 Pain in throat: Secondary | ICD-10-CM

## 2022-04-27 DIAGNOSIS — I25118 Atherosclerotic heart disease of native coronary artery with other forms of angina pectoris: Secondary | ICD-10-CM

## 2022-04-27 DIAGNOSIS — Z720 Tobacco use: Secondary | ICD-10-CM

## 2022-04-27 NOTE — Patient Instructions (Signed)
Medication Instructions:  The current medical regimen is effective;  continue present plan and medications.  *If you need a refill on your cardiac medications before your next appointment, please call your pharmacy*  Testing/Procedures: A chest x-ray takes a picture of the organs and structures inside the chest, including the heart, lungs, and blood vessels. This test can show several things, including, whether the heart is enlarges; whether fluid is building up in the lungs; and whether pacemaker / defibrillator leads are still in place. This will be completed at [facility].  Follow-Up: At [facility], you and your health needs are our priority.  As part of our continuing mission to provide you with exceptional heart care, we have created designated Provider Care Teams.  These Care Teams include your primary Cardiologist (physician) and Advanced Practice Providers (APPs -  Physician Assistants and Nurse Practitioners) who all work together to provide you with the care you need, when you need it.  We recommend signing up for the patient portal called "MyChart".  Sign up information is provided on this After Visit Summary.  MyChart is used to connect with patients for Virtual Visits (Telemedicine).  Patients are able to view lab/test results, encounter notes, upcoming appointments, etc.  Non-urgent messages can be sent to your provider as well.   To learn more about what you can do with MyChart, go to [email or web address].    Your next appointment:   1 year(s)  The format for your next appointment:   In Person  Provider:   Rollene Rotunda, MD{   Important Information About Sugar

## 2022-05-11 ENCOUNTER — Other Ambulatory Visit: Payer: Self-pay | Admitting: Cardiology

## 2022-06-27 NOTE — Progress Notes (Unsigned)
NEUROLOGY CONSULTATION NOTE  ARSALAN BRISBIN MRN: [id number] DOB: 07-19-1946  Referring provider: Lindajo Royal, MD Primary care provider: Lindajo Royal, MD  Reason for consult:  abnormal finding on brain imaging  Assessment/Plan:   Probable left thalamic stroke - suspect secondary to small vessel disease.  I do not suspect it is related to the carotid artery as it involves a different arterial supply. Intracranial carotid artery stenosis Left carotid artery stenosis, moderate CAD Hyperlipidemia Tobacco abuse   1 Continue current medication management as per PCP/cardiology:  - ASA 81mg  and Plavix 75mg  daily  - Statin.  LDL goal less than 70  - Normotensive blood pressure  - Hgb A1c goal less than 7 2 Most important recommendation would be to quit smoking 3  Mediterranean diet 4  Routine exercise 5  Follow up as needed.   Subjective:  Arthur Sullivan is a 76 year old right-handed male with COPD, CAD s/p MI, HLD, Peyronie's disease, chronic back pain, peripheral neuropathy and depression who presents for abnormal CT findings.  History supplemented by referring provider's note.  He is accompanied by his wife who also supplements history.  In July, he had episode of right sided numbness involving side of head, neck, arm, torso and down to hip lasting a week.  No associated pain or weakness.  CTA of head on 05/31/2022 revealed "1. Coarse calcific atherosclerosis involving the intracranial ICA siphons with up to moderate stenoses bilaterally.  2.  Mild proximal right V4 segment stenosis.  3.  Partially imaged bilateral cervical ICA atherosclerosis with mild stenosis, where imaged."  Carotid ultrasound on 11/23/2021 demonstrated "1. Atherosclerotic plaque in the left carotid bulb extending into the internal carotid artery results in moderate 50-69% stenosis by Doppler criteria. There is additional eccentric atherosclerotic plaque of the mid and distal common carotid artery.  2. No  hemodynamically significant stenosis of the right carotid circulation by Doppler criteria. There is scattered atherosclerotic plaque noted in the mid and distal common carotid artery. 3. Bilateral vertebral arteries demonstrate normal antegrade flow."  2D echocardiogram on 02/24/2022 revealed LVEF 60-65%, no regional wall motion abnormalities, mild MVR, mild calcification of the aortic valve with mild aortic valve regurg and mild aortic stenosis, and no atrial level shunt.   He takes ASA 81mg  and Plavix 75mg  since he had MI in 2022.  He also takes atorvastatin 40mg  and Lopressor 25mg  BID.    PAST MEDICAL HISTORY: Past Medical History:  Diagnosis Date   Anxiety    Benign positional vertigo    Cardiac arrest [facility])    History of this with a report of 5 stents.   COPD (chronic obstructive pulmonary disease) (HCC)    Dyslipidemia    Hiatal hernia    Hyperlipidemia    Kidney stones    Myocardial infarction [facility])    Peyronie's disease     PAST SURGICAL HISTORY: Past Surgical History:  Procedure Laterality Date   BACK SURGERY     LEFT HEART CATH AND CORONARY ANGIOGRAPHY N/A 11/16/2021   Procedure: LEFT HEART CATH AND CORONARY ANGIOGRAPHY;  Surgeon: 2023, MD;  Location: [facility] INVASIVE CV LAB;  Service: Cardiovascular;  Laterality: N/A;   NOSE SURGERY     TONSILLECTOMY      MEDICATIONS: Current Outpatient Medications on File Prior to Visit  Medication Sig Dispense Refill   albuterol (VENTOLIN HFA) 108 (90 Base) MCG/ACT inhaler Inhale into the lungs every 4 (four) hours as needed for wheezing or shortness of breath.  aspirin EC 81 MG tablet Take 81 mg by mouth daily. Swallow whole.     atorvastatin (LIPITOR) 40 MG tablet Take 40 mg by mouth daily.     brimonidine (ALPHAGAN P) 0.1 % SOLN      clopidogrel (PLAVIX) 75 MG tablet Take 1 tablet (75 mg total) by mouth daily. 90 tablet 2   dexlansoprazole (DEXILANT) 60 MG capsule Take 60 mg by mouth daily.     famotidine (PEPCID) 20  MG tablet One after supper 30 tablet 11   fluticasone (FLONASE) 50 MCG/ACT nasal spray Place 2 sprays into both nostrils daily.     furosemide (LASIX) 20 MG tablet Take 10 mg by mouth daily as needed.     latanoprost (XALATAN) 0.005 % ophthalmic solution 1 drop at bedtime.     LORazepam (ATIVAN) 0.5 MG tablet Take 0.5 mg by mouth every 8 (eight) hours.     metoprolol tartrate (LOPRESSOR) 25 MG tablet TAKE 1 TABLET BY MOUTH TWICE DAILY 180 tablet 0   timolol (TIMOPTIC) 0.5 % ophthalmic solution 1 drop 2 (two) times daily.     No current facility-administered medications on file prior to visit.    ALLERGIES: Allergies  Allergen Reactions   Diphenhydramine Other (See Comments)    Other reaction(s): Other Makes symptoms worse.   Erythromycin Other (See Comments)    Other reaction(s): Other Thrush   Morphine And Related Nausea And Vomiting and Nausea Only    Other reaction(s): Dizziness (intolerance), Unknown   Nitroglycerin Nausea And Vomiting    Other reaction(s): Unknown Lowers blood pressure    Azithromycin Other (See Comments)    Makes mouth sore and turn white inside   Dilaudid [Hydromorphone] Other (See Comments)    Feel drunk   Erythromycin Base     Other reaction(s): Angioedema (ALLERGY/intolerance)   Isosorbide Dinitrate Other (See Comments)    Makes throat hurt   Levofloxacin     Other reaction(s): Other (See Comments), vomiting Severe   Morphine Nausea And Vomiting   Cyclobenzaprine Nausea Only    FAMILY HISTORY: No family history on file.  Objective:  Blood pressure (!) 126/49, pulse 75, resp. rate 18, height 5\' 5"  (1.651 m), weight 129 lb (58.5 kg), SpO2 98 %. General: No acute distress.  Patient appears well-groomed.   Head:  Normocephalic/atraumatic Eyes:  fundi examined but not visualized Neck: supple, no paraspinal tenderness, full range of motion Back: No paraspinal tenderness Heart: regular rate and rhythm Lungs: Clear to auscultation  bilaterally. Vascular: No carotid bruits. Neurological Exam: Mental status: alert and oriented to person, place, and time, speech fluent and not dysarthric, language intact. Cranial nerves: CN I: not tested CN II: pupils equal, round and reactive to light, visual fields intact CN III, IV, VI:  full range of motion, no nystagmus, no ptosis CN V: facial sensation intact. CN VII: upper and lower face symmetric CN VIII: hearing intact CN IX, X: gag intact, uvula midline CN XI: sternocleidomastoid and trapezius muscles intact CN XII: tongue midline Bulk & Tone: normal, no fasciculations. Motor:  muscle strength 5/5 throughout Sensation:  Pinprick,and vibratory sensation reduced in toes. Deep Tendon Reflexes:  2+ throughout,  toes downgoing.   Finger to nose testing:  Without dysmetria.   Heel to shin:  Without dysmetria.   Gait:  Broad-based gait.  Romberg with mild sway.    Thank you for allowing me to take part in the care of this patient.  , DO  CC: Shon Millet, MD

## 2022-06-28 ENCOUNTER — Ambulatory Visit (INDEPENDENT_AMBULATORY_CARE_PROVIDER_SITE_OTHER): Payer: Medicare Other | Admitting: Neurology

## 2022-06-28 ENCOUNTER — Encounter: Payer: Self-pay | Admitting: Neurology

## 2022-06-28 VITALS — BP 126/49 | HR 75 | Resp 18 | Ht 65.0 in | Wt 129.0 lb

## 2022-06-28 DIAGNOSIS — Z720 Tobacco use: Secondary | ICD-10-CM | POA: Diagnosis not present

## 2022-06-28 DIAGNOSIS — I6522 Occlusion and stenosis of left carotid artery: Secondary | ICD-10-CM | POA: Diagnosis not present

## 2022-06-28 DIAGNOSIS — I6381 Other cerebral infarction due to occlusion or stenosis of small artery: Secondary | ICD-10-CM

## 2022-06-28 DIAGNOSIS — I25118 Atherosclerotic heart disease of native coronary artery with other forms of angina pectoris: Secondary | ICD-10-CM

## 2022-06-28 DIAGNOSIS — I6523 Occlusion and stenosis of bilateral carotid arteries: Secondary | ICD-10-CM

## 2022-06-28 DIAGNOSIS — E785 Hyperlipidemia, unspecified: Secondary | ICD-10-CM

## 2022-06-28 NOTE — Patient Instructions (Signed)
I think you had a stroke.  I don't think it is related to the carotid artery.  I wouldn't make any change to your medications.  The most important thing to do is to quit smoking

## 2022-08-12 ENCOUNTER — Other Ambulatory Visit: Payer: Self-pay | Admitting: Cardiology

## 2022-08-23 ENCOUNTER — Telehealth: Payer: Self-pay | Admitting: Cardiology

## 2022-08-23 NOTE — Telephone Encounter (Addendum)
Patient stated that every time his heart rate increases or he takes a deep mouth breath, he feels a sharp pain in his throat at the top of his esophagus. He is on PPIs and took a ranolazine, which neither helped. Sucking on candy doesn't help either. He stated he has no problem with swallowing. He has seen GI and ENT without any answers to his issue. He believes the sharp pain he gets in his throat is cardiac-related. He wanted an appointment with Dr. Percival Spanish. Made appointment in [facility] [facility] for 12/20. He prefers [place] because it is difficult for him to get a Lucianne Lei ride to [place]. Please advise. Patient also stated his eyes are swollen, Rt more than left) he goes to PCP tomorrow.

## 2022-08-23 NOTE — Telephone Encounter (Signed)
Patient states he has been having pain in his throat when he breathes in through his mouth. This has been going on for over a year, since about March/April 2022. Patient denies CP, but states sometimes he has heart burn. Patient also states he woke up Sunday morning and his left eye was swollen--patient states his wife assumes this may be due to Ranolazine, but he doesn't think so because he states his eye was swollen before he took the medication.

## 2022-08-25 ENCOUNTER — Telehealth: Payer: Self-pay | Admitting: Cardiology

## 2022-08-25 NOTE — Telephone Encounter (Signed)
Patient asked if Dr. Antoine Poche would call patient to discuss medications and the sharp pain he gets in his throat. Patient has OV in [place] for 11/17 and [place] 12/20 ( on wait list). Please advise.

## 2022-08-25 NOTE — Telephone Encounter (Signed)
Patient stated he is scheduled for a follow-up visit on 12/20 regarding his new medication.  Patient would like to know if he can cancel the in-person visit and just speak with the doctor about the mediation.

## 2022-08-25 NOTE — Telephone Encounter (Signed)
Patient placed on call wait list.

## 2022-08-26 NOTE — Telephone Encounter (Signed)
Spoke with patient to inform him to keep his appointment with Dr. Antoine Poche on 11/17. Patient said he will arrange for a ride to come. Patient also stated he is back on ranexa 500mg  twice daily since 08/20/22. He wants to to discuss getting new prescription for it at his appointment. Aslo advised patient that if his throat pain get worse, he should go to the ED. He verbalized understanding.

## 2022-09-01 DIAGNOSIS — D649 Anemia, unspecified: Secondary | ICD-10-CM | POA: Insufficient documentation

## 2022-09-01 NOTE — Progress Notes (Signed)
Cardiology Office Note   Date:  09/02/2022   ID:  Darryl, Willner 07/12/1946, MRN [id number]  PCP:  Veverly Fells, MD  Cardiologist:   Rollene Rotunda, MD   Chief Complaint  Patient presents with   Throat pain      History of Present Illness: FINNIAN HUSTED is a 76 y.o. male who presents for evaluation of CAD.  He said in 2001 he had 4 stents at [facility].  In 2004 he had 1 stent.  In March 2022 he was at [facility].  He ruled in for NSTEMI initially but left AGAINST MEDICAL ADVICE.  He had recurrent chest discomfort and came in and was found to have LAD 20 to 30% stenosis, left circumflex 99% stenosis and a previous stent and it was not thought to be amenable to PCI.  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  In early April at [facility] catheterization demonstrated that the RCA was patent.  At [facility] it looks like they were not able to do a balloon angioplasty but could not pass a stent.  This was described as a tortuous origin with calcification of this OM.  No stent was deployed.  They suggested that no further attempts at the OM and revascularize should be attempted because of the poor target.  I sent him for evaluation of SOB and he has moderately severe airway obstruction.  I sent him for ABIs for foot pain and these were normal.    Cardic cath in Jan 2023 demonstrated results as below.  This was done because of pain in his throat with actiity.    He was managed medically.  He called with continued neck pain recently.  He has been to see GI.  They were planning manometry.  However, it sounds like they were not able to do that for some reason.  There was a speech swallow study suggested but he does not want to do this.  He said he is also been seen by ENT.  He continues to have a throat pain unchanged from the time of his cath earlier this year.  He does not take nitroglycerin as he says he is allergic to it.  He did not want to take Ranexa  although he finally started taking 500 mg twice a day earlier this month.  He had no improvement in his pain at this point.  He is seen pulmonary does not want to go back to see that physician.  He describes his discomfort more at night.  He has trouble going to sleep because of it.  He is limited in his activities because of it.  He is also had some lower extremity swelling which has been mild and for which she takes as needed Lasix.    Past Medical History:  Diagnosis Date   Anxiety    Benign positional vertigo    Cardiac arrest [facility])    History of this with a report of 5 stents.   COPD (chronic obstructive pulmonary disease) (HCC)    Dyslipidemia    Hiatal hernia    Hyperlipidemia    Kidney stones    Myocardial infarction [facility])    Peyronie's disease     Past Surgical History:  Procedure Laterality Date   BACK SURGERY     LEFT HEART CATH AND CORONARY ANGIOGRAPHY N/A 11/16/2021   Procedure: LEFT HEART CATH AND CORONARY ANGIOGRAPHY;  Surgeon: Corky Crafts, MD;  Location: [facility] INVASIVE  CV LAB;  Service: Cardiovascular;  Laterality: N/A;   NOSE SURGERY     TONSILLECTOMY       Current Outpatient Medications  Medication Sig Dispense Refill   albuterol (VENTOLIN HFA) 108 (90 Base) MCG/ACT inhaler Inhale into the lungs every 4 (four) hours as needed for wheezing or shortness of breath.     aspirin EC 81 MG tablet Take 81 mg by mouth daily. Swallow whole.     atorvastatin (LIPITOR) 40 MG tablet Take 40 mg by mouth daily.     brimonidine (ALPHAGAN P) 0.1 % SOLN      clopidogrel (PLAVIX) 75 MG tablet Take 1 tablet (75 mg total) by mouth daily. 90 tablet 2   dexlansoprazole (DEXILANT) 60 MG capsule Take 60 mg by mouth daily.     famotidine (PEPCID) 20 MG tablet One after supper 30 tablet 11   fluticasone (FLONASE) 50 MCG/ACT nasal spray Place 2 sprays into both nostrils daily.     furosemide (LASIX) 20 MG tablet Take 10 mg by mouth daily as needed.     latanoprost (XALATAN) 0.005 %  ophthalmic solution 1 drop at bedtime.     LORazepam (ATIVAN) 0.5 MG tablet Take 0.5 mg by mouth every 8 (eight) hours.     metoprolol tartrate (LOPRESSOR) 25 MG tablet TAKE 1 TABLET BY MOUTH TWICE DAILY 180 tablet 2   ranolazine (RANEXA) 1000 MG SR tablet Take 1 tablet (1,000 mg total) by mouth 2 (two) times daily. 180 tablet 3   timolol (TIMOPTIC) 0.5 % ophthalmic solution 1 drop 2 (two) times daily.     No current facility-administered medications for this visit.    Allergies:   Diphenhydramine, Erythromycin, Morphine and related, Nitroglycerin, Azithromycin, Dilaudid [hydromorphone], Erythromycin base, Isosorbide dinitrate, Levofloxacin, Morphine, and Cyclobenzaprine   ROS:  Please see the history of present illness.   Otherwise, review of systems are positive for none.   All other systems are reviewed and negative.    PHYSICAL EXAM: VS:  BP 122/60   Pulse 64   Ht 5\' 5"  (1.651 m)   Wt 130 lb 12.8 oz (59.3 kg)   SpO2 98%   BMI 21.77 kg/m  , BMI Body mass index is 21.77 kg/m. GENERAL:  Well appearing NECK:  No jugular venous distention, waveform within normal limits, carotid upstroke brisk and symmetric, no bruits, no thyromegaly LUNGS:  Clear to auscultation bilaterally CHEST:  Unremarkable HEART:  PMI not displaced or sustained,S1 and S2 within normal limits, no S3, no S4, no clicks, no rubs, 2 out of 6 apical systolic murmur radiating slightly at the aortic outflow tract, no diastolic murmurs ABD:  Flat, positive bowel sounds normal in frequency in pitch, no bruits, no rebound, no guarding, no midline pulsatile mass, no hepatomegaly, no splenomegaly EXT:  2 plus pulses throughout, no edema, no cyanosis no clubbing   EKG:  EKG is  ordered today. The ekg ordered today demonstrates sinus rhythm, rate 64 axis within normal limits, intervals within normal limits, no acute ST-T wave changes.    Recent Labs: 11/12/2021: BUN 15; Creatinine, Ser 0.68; Hemoglobin 12.0; Platelets 319;  Potassium 4.8; Sodium 142    Lipid Panel No results found for: "CHOL", "TRIG", "HDL", "CHOLHDL", "VLDL", "LDLCALC", "LDLDIRECT"    Wt Readings from Last 3 Encounters:  09/02/22 130 lb 12.8 oz (59.3 kg)  06/28/22 129 lb (58.5 kg)  04/27/22 127 lb (57.6 kg)     CARDIAC CATH:  Diagnostic Dominance: Right    Other studies Reviewed:  Additional studies/ records that were reviewed today include: Care Everywhere Review of the above records demonstrates:  Please see elsewhere in the note.     ASSESSMENT AND PLAN:  THROAT PAIN:     I will increase his Ranexa to 1000 mg twice daily although this does not sound like anginal pain.  He is tolerating the lower dose.  He does not have disease amenable to PCI.  The pain is completely unchanged from when he had his most recent catheterization.  I have encouraged him to go back to Veverly Fells, MD to discuss what the next steps with GI or ENT would be.   SOB:   He does not want go back and see pulmonary.  We talked about his smoking again he does not want to quit smoking.  He had no heart failure evident on chest x-rays or echocardiography.    TOBACCO ABUSE:   He has no intention of quitting smoking.  CAD:    As above he needs continued risk reduction.  CAROTID BRUITS:    He had internal carotid 50 to 69% stenosis.  He should have scheduled follow-up in January 2024.  AI:  This was mild on echo in May.  No change in therapy.  Current medicines are reviewed at length with the patient today.  The patient does not have concerns regarding medicines.  The following changes have been made:  As above  Labs/ tests ordered today include:    None  Orders Placed This Encounter  Procedures   EKG 12-Lead      Disposition:   Follow-up with me in about 3 months   Signed, Rollene Rotunda, MD  09/02/2022 12:21 PM    [facility]

## 2022-09-02 ENCOUNTER — Telehealth: Payer: Self-pay

## 2022-09-02 ENCOUNTER — Encounter: Payer: Self-pay | Admitting: Cardiology

## 2022-09-02 ENCOUNTER — Ambulatory Visit: Payer: Medicare Other | Attending: Cardiology | Admitting: Cardiology

## 2022-09-02 VITALS — BP 122/60 | HR 64 | Ht 65.0 in | Wt 130.8 lb

## 2022-09-02 DIAGNOSIS — R0602 Shortness of breath: Secondary | ICD-10-CM

## 2022-09-02 DIAGNOSIS — I2510 Atherosclerotic heart disease of native coronary artery without angina pectoris: Secondary | ICD-10-CM | POA: Diagnosis not present

## 2022-09-02 DIAGNOSIS — R070 Pain in throat: Secondary | ICD-10-CM

## 2022-09-02 DIAGNOSIS — D649 Anemia, unspecified: Secondary | ICD-10-CM

## 2022-09-02 DIAGNOSIS — R0989 Other specified symptoms and signs involving the circulatory and respiratory systems: Secondary | ICD-10-CM

## 2022-09-02 DIAGNOSIS — I2583 Coronary atherosclerosis due to lipid rich plaque: Secondary | ICD-10-CM

## 2022-09-02 MED ORDER — RANOLAZINE ER 1000 MG PO TB12
1000.0000 mg | ORAL_TABLET | Freq: Two times a day (BID) | ORAL | 3 refills | Status: DC
Start: 2022-09-02 — End: 2024-02-07

## 2022-09-02 NOTE — Addendum Note (Signed)
Addended by: Lamar Benes on: 09/02/2022 02:08 PM   Modules accepted: Orders

## 2022-09-02 NOTE — Patient Instructions (Addendum)
Medication Instructions:  Stop Ranexa 500 mg twice daily Start Ranexa 1,000 mg twice daily *If you need a refill on your cardiac medications before your next appointment, please call your pharmacy*   Lab Work: NONE ordered at this time of appointment   If you have labs (blood work) drawn today and your tests are completely normal, you will receive your results only by: MyChart Message (if you have MyChart) OR A paper copy in the mail If you have any lab test that is abnormal or we need to change your treatment, we will call you to review the results.   Testing/Procedures: NONE ordered at this time of appointment     Follow-Up: At [facility], you and your health needs are our priority.  As part of our continuing mission to provide you with exceptional heart care, we have created designated Provider Care Teams.  These Care Teams include your primary Cardiologist (physician) and Advanced Practice Providers (APPs -  Physician Assistants and Nurse Practitioners) who all work together to provide you with the care you need, when you need it.  We recommend signing up for the patient portal called "MyChart".  Sign up information is provided on this After Visit Summary.  MyChart is used to connect with patients for Virtual Visits (Telemedicine).  Patients are able to view lab/test results, encounter notes, upcoming appointments, etc.  Non-urgent messages can be sent to your provider as well.   To learn more about what you can do with MyChart, go to [email or web address].    Your next appointment:   3 months  The format for your next appointment:   In Person [facility])  Provider:   Rollene Rotunda, MD     Other Instructions   Important Information About Sugar

## 2022-09-06 NOTE — Telephone Encounter (Signed)
Opened in error

## 2022-10-05 ENCOUNTER — Telehealth: Payer: Self-pay | Admitting: Cardiology

## 2022-10-05 ENCOUNTER — Ambulatory Visit: Payer: Medicare Other | Admitting: Cardiology

## 2022-10-05 NOTE — Telephone Encounter (Signed)
Spoke with patient of Dr. Antoine Poche. Explained that reason for carotid doppler was to f/u on carotid artery disease from last year. He said his main issue is his throat pain. He said he can't walk the length of his house without the pain occurring. He said if his heart speeds up, his throat hurts. He said the pain is from his heart. He said Ranexa did not help so he stopped it. He said he is already on 2 GI meds and was suggested he have a swallow study for throat pain but isn't sure why he needs this. He is frustrated that he has not gotten any answers about his throat pain and feels like he is going to lots of doctors and not getting answers  Advised will send a message to Dr. Antoine Poche to review

## 2022-10-05 NOTE — Telephone Encounter (Signed)
Rollene Rotunda, MD  Lindell Spar, RN Caller: Unspecified (Today, 12:49 PM)  He had a cath earlier this year at [facility] for the same symptoms and was not thought to be a PCI candidate.  He previously had the same opinion at [facility] and [facility]  In March 2022 he was at [facility].  He ruled in for NSTEMI initially but left AGAINST MEDICAL ADVICE.  He had recurrent chest discomfort and came in and was found to have LAD 20 to 30% stenosis, left circumflex 99% stenosis and a previous stent and it was not thought to be amenable to PCI.  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  In early April at [facility] catheterization demonstrated that the RCA was patent.  At [facility] it looks like they were able to do a balloon angioplasty but could not pass a stent.  Was described as a tortuous origin with calcification of this OM.  No stent was deployed.  They suggested that no further attempts at the OM and revascularize should be attempted because of the poor target.  I will have againone of my interventional colleagues review this to see if we should reconsider treatment of the chronic occlusion.,  But does not seem like it is technically possible per the previous notes and is not entirely clear and would be beneficial.  He certainly could get another opinion at [facility] or [place].  He arranges this would be happy to make sure they have access to his most recent films.

## 2022-10-05 NOTE — Telephone Encounter (Signed)
Spoke with patient and relayed info from Dr. Antoine Poche  Routed to primary nurse for any follow up once Dr. Antoine Poche has info from interventionalist

## 2022-10-05 NOTE — Telephone Encounter (Signed)
Patient states that he already had vas US carotid done. So he dont see why another one is needed. Please advise

## 2022-10-14 NOTE — Telephone Encounter (Signed)
Attempted to call patient, left message for patient to call back to office.     Rollene Rotunda, MD  Freddi Starr, RN Please notify the patient.  He called yesterday and asked a question about his throat pain.  I did review the catheterization earlier this year with a colleague and again the opinion is that there was not a vessel to intervene on.  His symptoms have been ongoing since his catheterization and previous catheterizations which suggested only medical management for this coronary disease.  I would certainly encourage him to get another opinion and he has not been to [facility] or [facility] but this may be nonanginal throat pain but he is certainly encouraged to get another opinion.   We are continuing medical management for his known coronary disease.  We are continuing medical management for his known coronary disease.

## 2022-10-14 NOTE — Telephone Encounter (Signed)
[id number] 

## 2022-10-14 NOTE — Telephone Encounter (Signed)
Patient is returning call.  °

## 2022-10-18 ENCOUNTER — Telehealth: Payer: Self-pay | Admitting: *Deleted

## 2022-10-18 NOTE — Telephone Encounter (Signed)
Spoke with pt, Aware of dr hochrein's recommendations.  

## 2022-10-18 NOTE — Telephone Encounter (Signed)
Spoke with pt, aware of the recommendations. He is not interested in referral at this time but encouraged patient to consider if he continues to have pain. He will call back if he wants a 2nd option.

## 2022-10-18 NOTE — Telephone Encounter (Signed)
-----   Message from Minus Breeding, MD sent at 10/07/2022  1:38 PM EST ----- Please notify the patient.  He called yesterday and asked a question about his throat pain.  I did review the catheterization earlier this year with a colleague and again the opinion is that there was not a vessel to intervene on.  His symptoms have been ongoing since his catheterization and previous catheterizations which suggested only medical management for this coronary disease.  I would certainly encourage him to get another opinion and he has not been to [facility] or [facility] but this may be nonanginal throat pain but he is certainly encouraged to get another opinion.   We are continuing medical management for his known coronary disease.  We are continuing medical management for his known coronary disease.   ----- Message ----- From: Jettie Booze, MD Sent: 10/06/2022  10:04 PM EST To: Minus Breeding, MD  I wouldn't attempt this. The vessel is small. If you look at the note from [place] in 2022 they were able to cross and balloon but could not pass a stent. His assessment was that this should not be attempted again due to difficulty and small vessel size.  He has been having this throat pain since 4 /22 at least.  I think risk outweighs benefits here as stent would have to extend in to the left main.   JV ----- Message ----- From: Minus Breeding, MD Sent: 10/05/2022   2:04 PM EST To: Jettie Booze, MD  Ulice Dash,  Can you look at his films.  He has a lot of throat pain with exertion.  I am not sure its angina but it could be.  He had a cath.  Not to be a candidate for anything but he thinks possibly could be considered for treatment of CTO.  Thanks.  Maylon Cos

## 2023-01-23 ENCOUNTER — Ambulatory Visit (HOSPITAL_COMMUNITY)
Admission: RE | Admit: 2023-01-23 | Discharge: 2023-01-23 | Disposition: A | Discharge status: Home / Self Care | Payer: 59 | Source: Ambulatory Visit | Attending: Cardiology | Admitting: Cardiology

## 2023-01-23 DIAGNOSIS — R0989 Other specified symptoms and signs involving the circulatory and respiratory systems: Secondary | ICD-10-CM | POA: Diagnosis present

## 2023-01-26 ENCOUNTER — Other Ambulatory Visit: Payer: Self-pay | Admitting: *Deleted

## 2023-01-26 ENCOUNTER — Encounter: Payer: Self-pay | Admitting: *Deleted

## 2023-01-26 DIAGNOSIS — R0989 Other specified symptoms and signs involving the circulatory and respiratory systems: Secondary | ICD-10-CM

## 2023-04-08 ENCOUNTER — Other Ambulatory Visit: Payer: Self-pay | Admitting: Cardiology

## 2023-07-08 ENCOUNTER — Other Ambulatory Visit: Payer: Self-pay | Admitting: Cardiology

## 2023-08-18 ENCOUNTER — Other Ambulatory Visit: Payer: Self-pay | Admitting: Cardiology

## 2023-10-20 ENCOUNTER — Other Ambulatory Visit: Payer: Self-pay | Admitting: Cardiology

## 2023-10-31 IMAGING — US US CAROTID DUPLEX BILAT
1 series · 13 of 24 positions shown · non-contrast
Comparison: None.

CLINICAL DATA: bruit

EXAM:
BILATERAL CAROTID DUPLEX ULTRASOUND
TECHNIQUE: Gray scale imaging, color Doppler and duplex ultrasound were
performed of bilateral carotid and vertebral arteries in the neck.

[Series 1: us carotid bilateral · 13 of 68 slices shown]
[im 1/68]
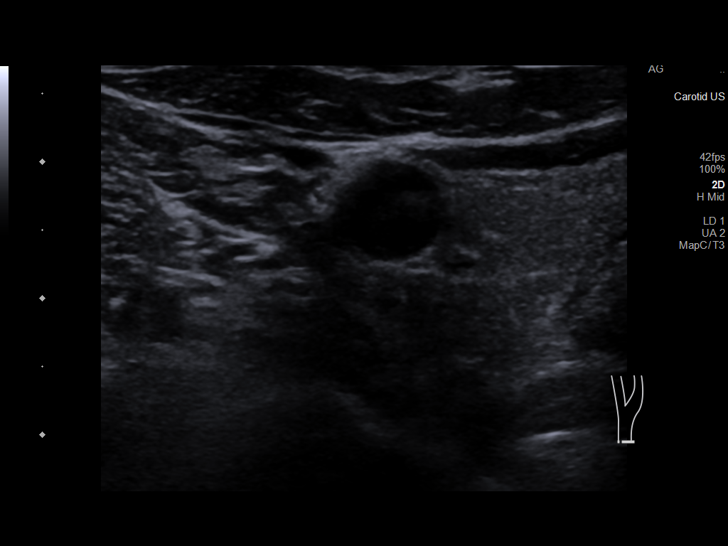
[im 6/68]
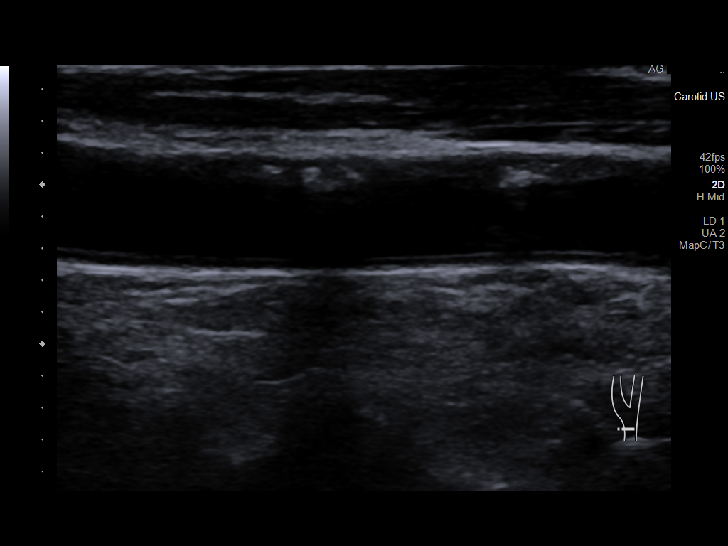
[im 12/68]
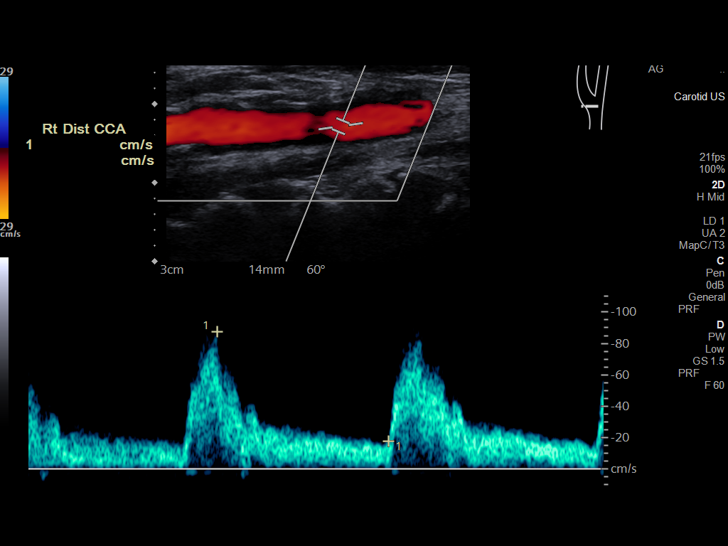
[im 18/68]
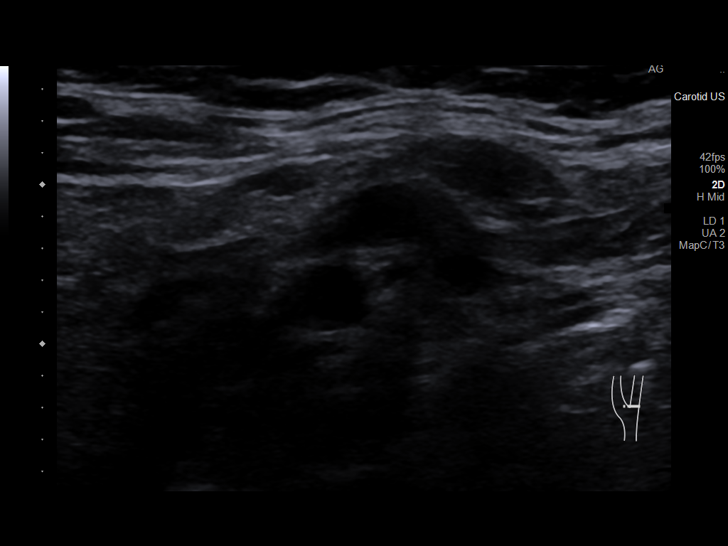
[im 24/68]
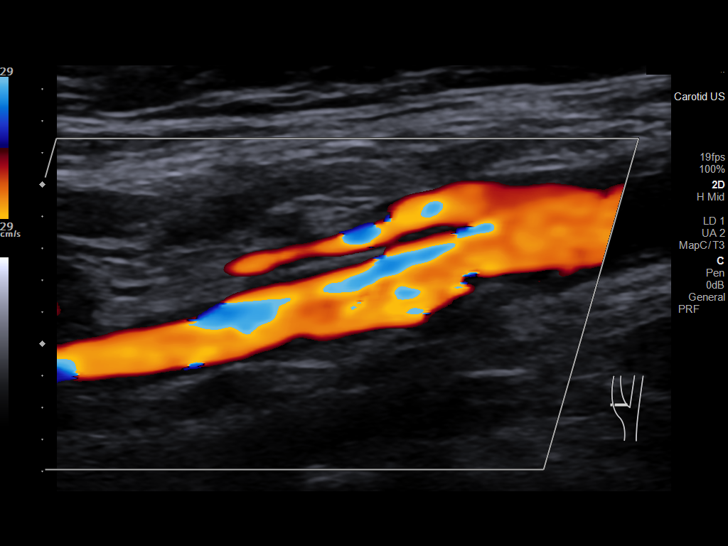
[im 30/68]
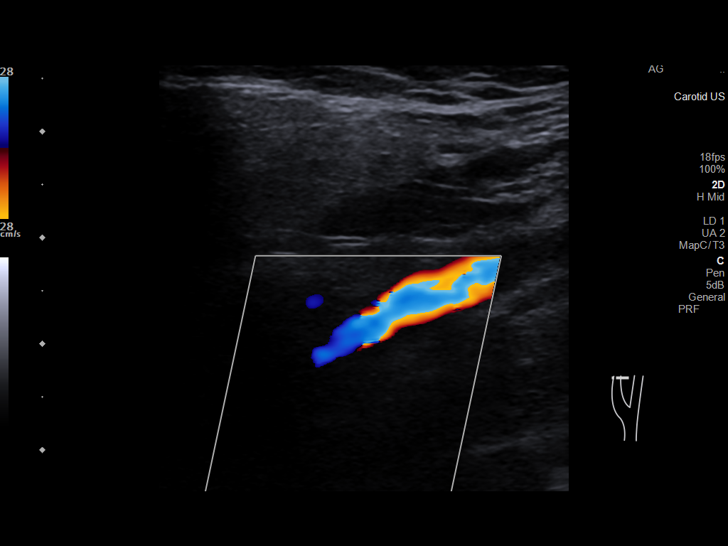
[im 35/68]
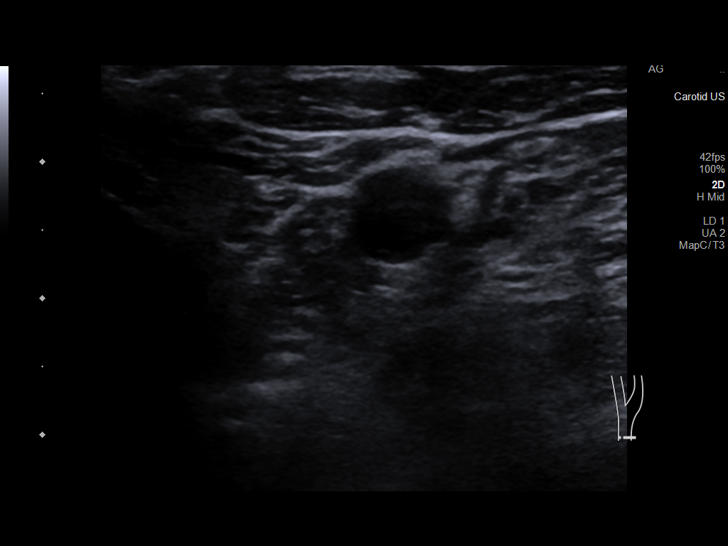
[im 38/68]
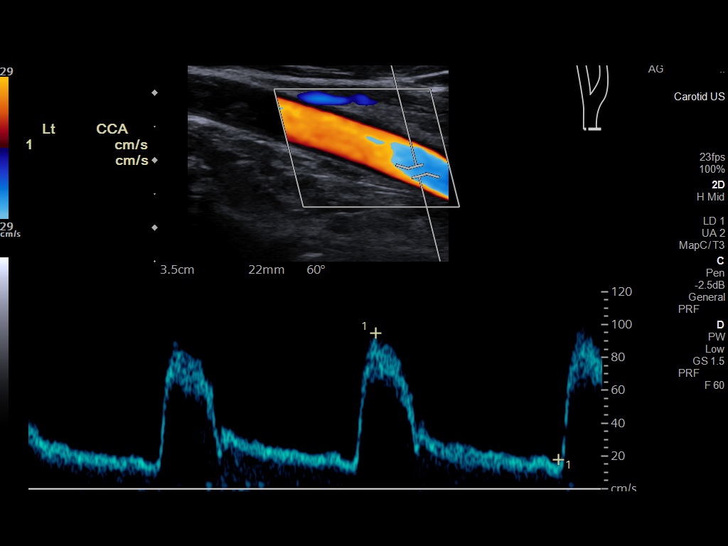
[im 44/68]
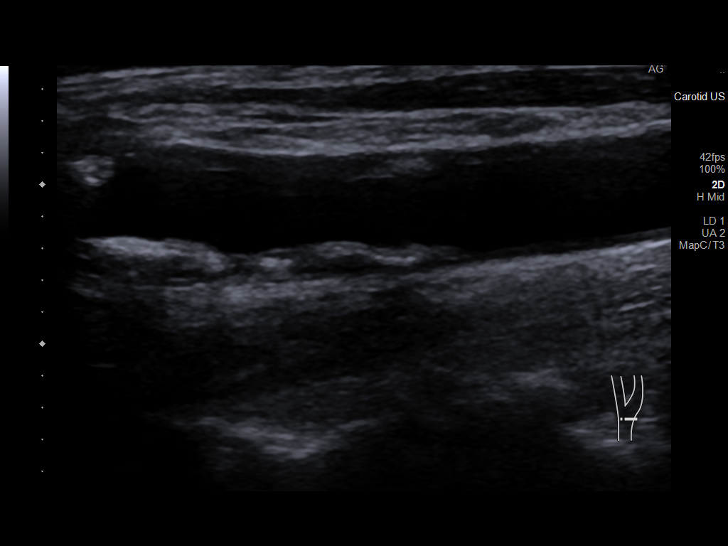
[im 50/68]
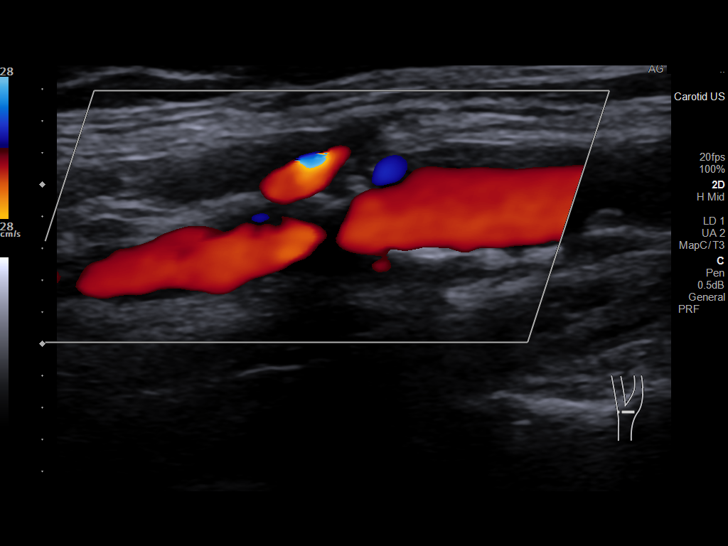
[im 56/68]
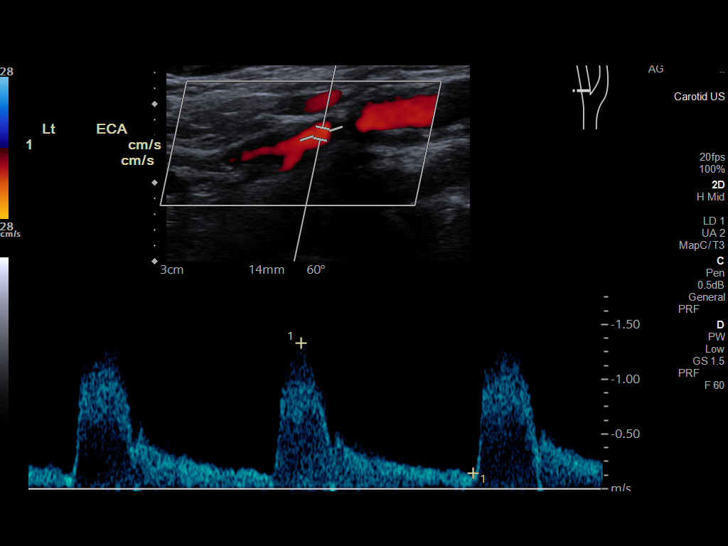
[im 62/68]
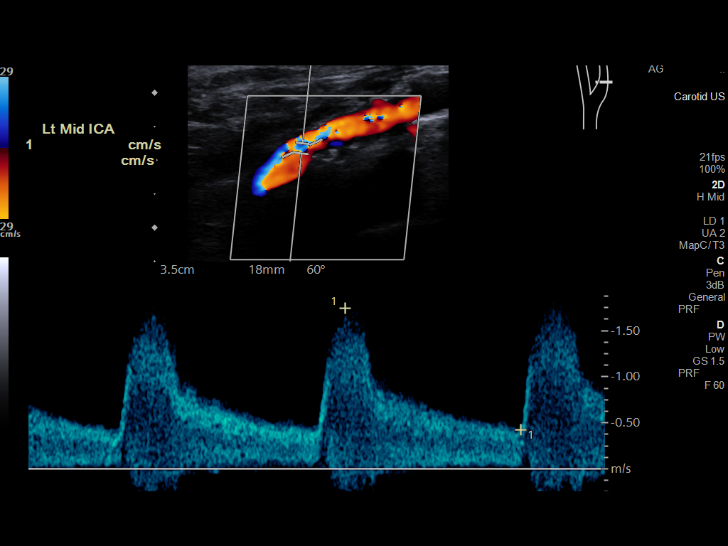
[im 68/68]
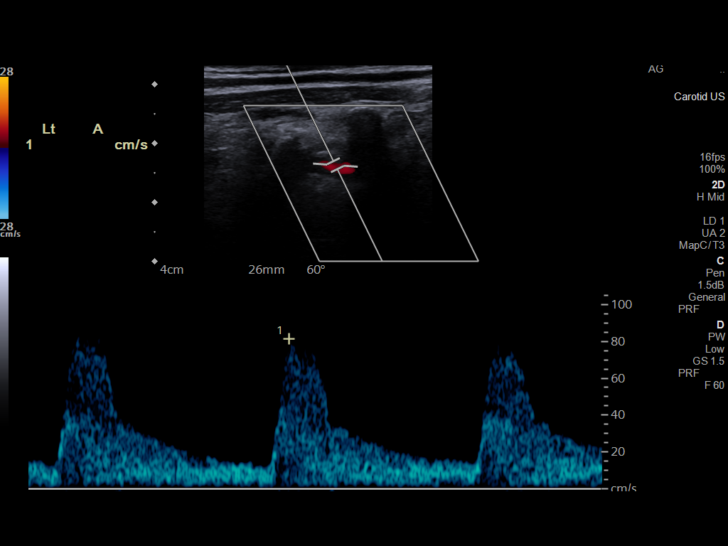

[13 of 24 positions shown; findings below may reference images not displayed]

FINDINGS: Criteria: Quantification of carotid stenosis is based on velocity
parameters that correlate the residual internal carotid diameter
with NASCET-based stenosis levels, using the diameter of the distal
internal carotid lumen as the denominator for stenosis measurement.

The following velocity measurements were obtained:

RIGHT

ICA: 119/36 cm/sec

CCA: 99/20 cm/sec

SYSTOLIC ICA/CCA RATIO:

ECA: 94 cm/sec

LEFT

ICA: 174/42 cm/sec

CCA: 96/19 cm/sec

SYSTOLIC ICA/CCA RATIO:

ECA: 133 cm/sec

RIGHT CAROTID ARTERY: Scattered atherosclerotic plaque of the mid
and distal common carotid artery. No significant plaque appreciated
in the carotid bulb or internal carotid artery. No significant
stenosis by Doppler criteria. Normal low resistance waveforms in the
internal carotid artery.

RIGHT VERTEBRAL ARTERY:  Antegrade flow

LEFT CAROTID ARTERY: Eccentric atherosclerotic plaque involving the
mid and distal common carotid artery. There is more circumferential
atherosclerotic plaque involving the carotid bulb extending into the
proximal internal carotid artery. Elevated velocities are noted in
the proximal and mid internal carotid artery suggestive of 50-69%
stenosis. Normal low resistance waveforms in the internal carotid
artery.

LEFT VERTEBRAL ARTERY:  Antegrade flow
IMPRESSION: 1. Atherosclerotic plaque in the left carotid bulb extending into
the internal carotid artery results in moderate 50-69% stenosis by
Doppler criteria. There is additional eccentric atherosclerotic
plaque of the mid and distal common carotid artery.
2. No hemodynamically significant stenosis of the right carotid
circulation by Doppler criteria. There is scattered atherosclerotic
plaque noted in the mid and distal common carotid artery.
3. Bilateral vertebral arteries demonstrate normal antegrade flow.

## 2023-11-01 ENCOUNTER — Telehealth: Payer: Self-pay | Admitting: Cardiology

## 2023-11-01 MED ORDER — CLOPIDOGREL BISULFATE 75 MG PO TABS: 75.0000 mg | ORAL_TABLET | Freq: Every day | ORAL | 0 refills | Status: DC

## 2023-11-01 NOTE — Telephone Encounter (Signed)
*  STAT* If patient is at the pharmacy, call can be transferred to refill team.   1. Which medications need to be refilled? (please list name of each medication and dose if known)   clopidogrel  (PLAVIX ) 75 MG tablet     4. Which pharmacy/location (including street and city if local pharmacy) is medication to be sent to? [facility] [place] - [address]     5. Do they need a 30 day or 90 day supply? 90   Pt states he can only do [place], he is scheduled for next available 02/07/24

## 2023-11-17 IMAGING — DX DG CHEST 2V
2 series · 2 of 2 positions shown · non-contrast
Comparison: None.

CLINICAL DATA: Dyspnea on exertion. Shortness of breath. History of
COPD.

EXAM:
CHEST - 2 VIEW

[chest pa]
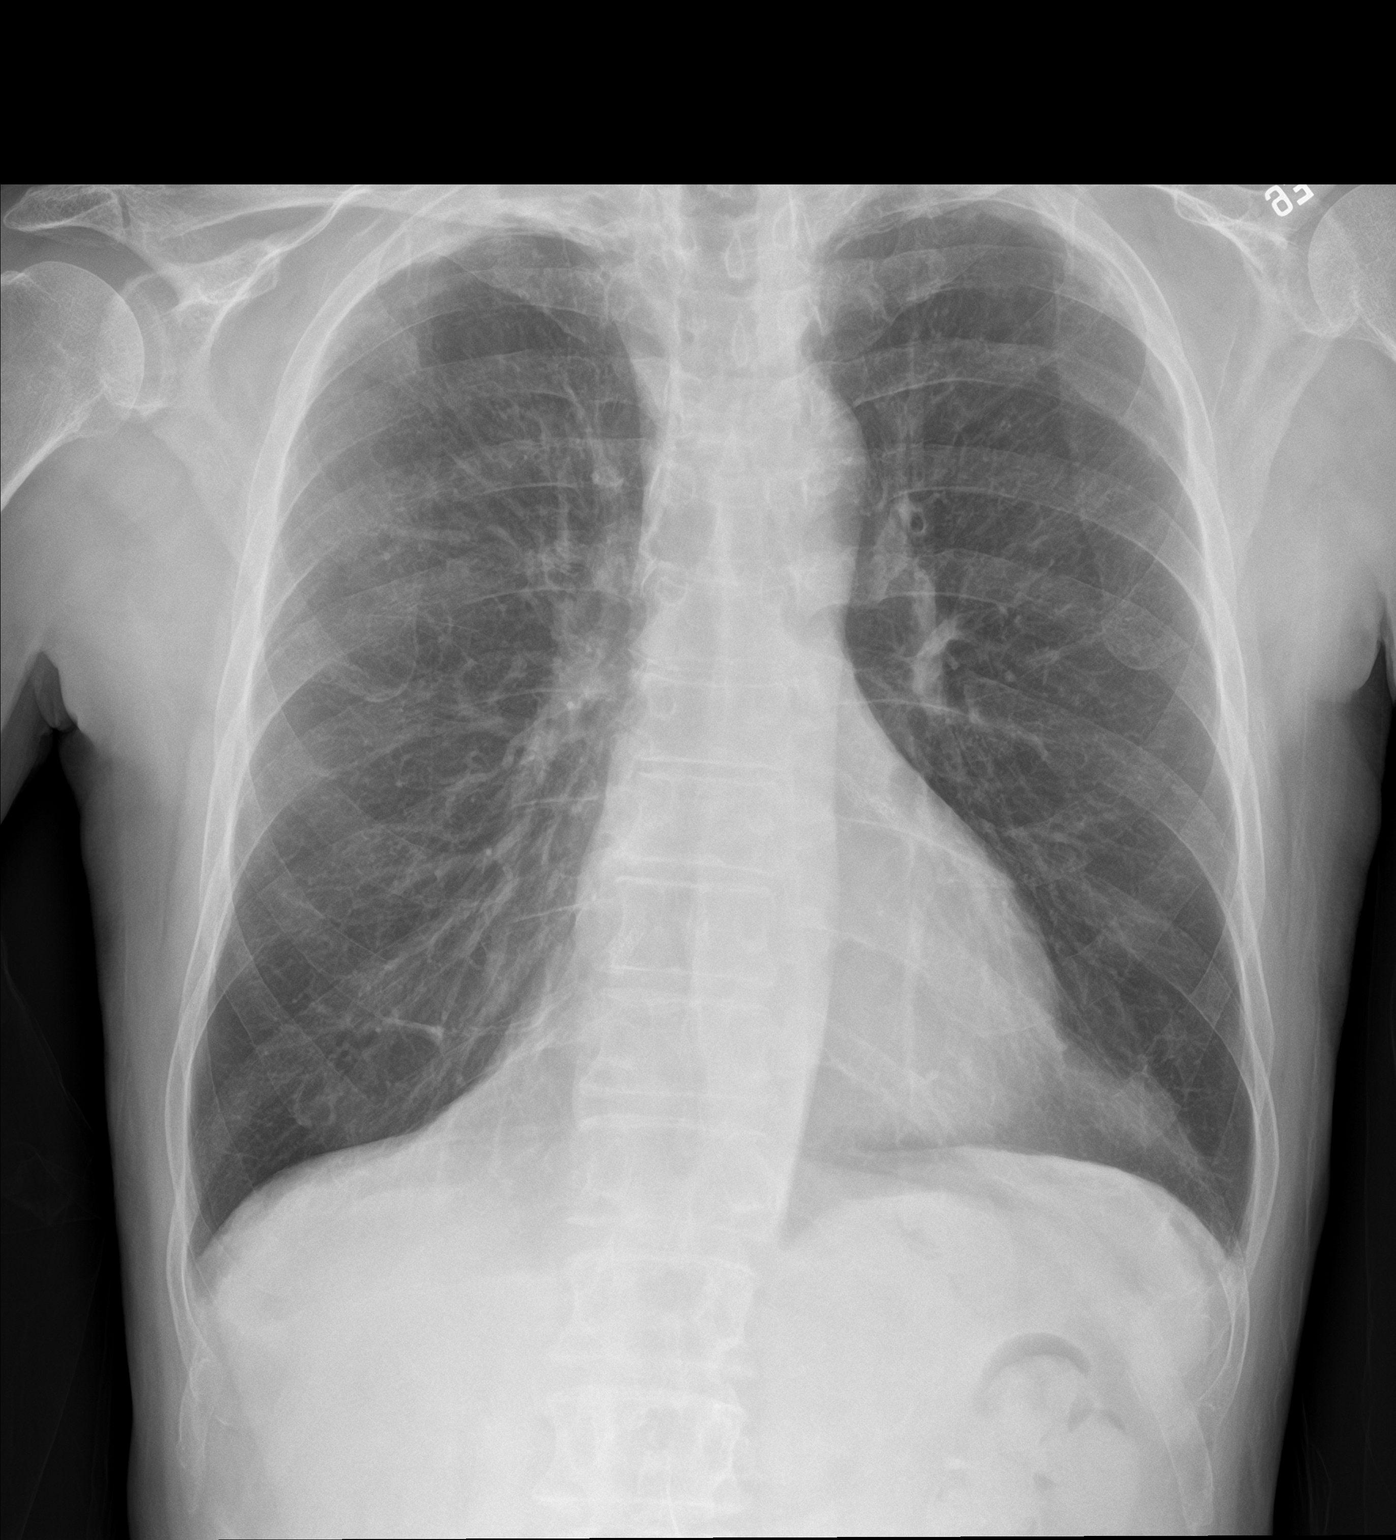

[chest lat]
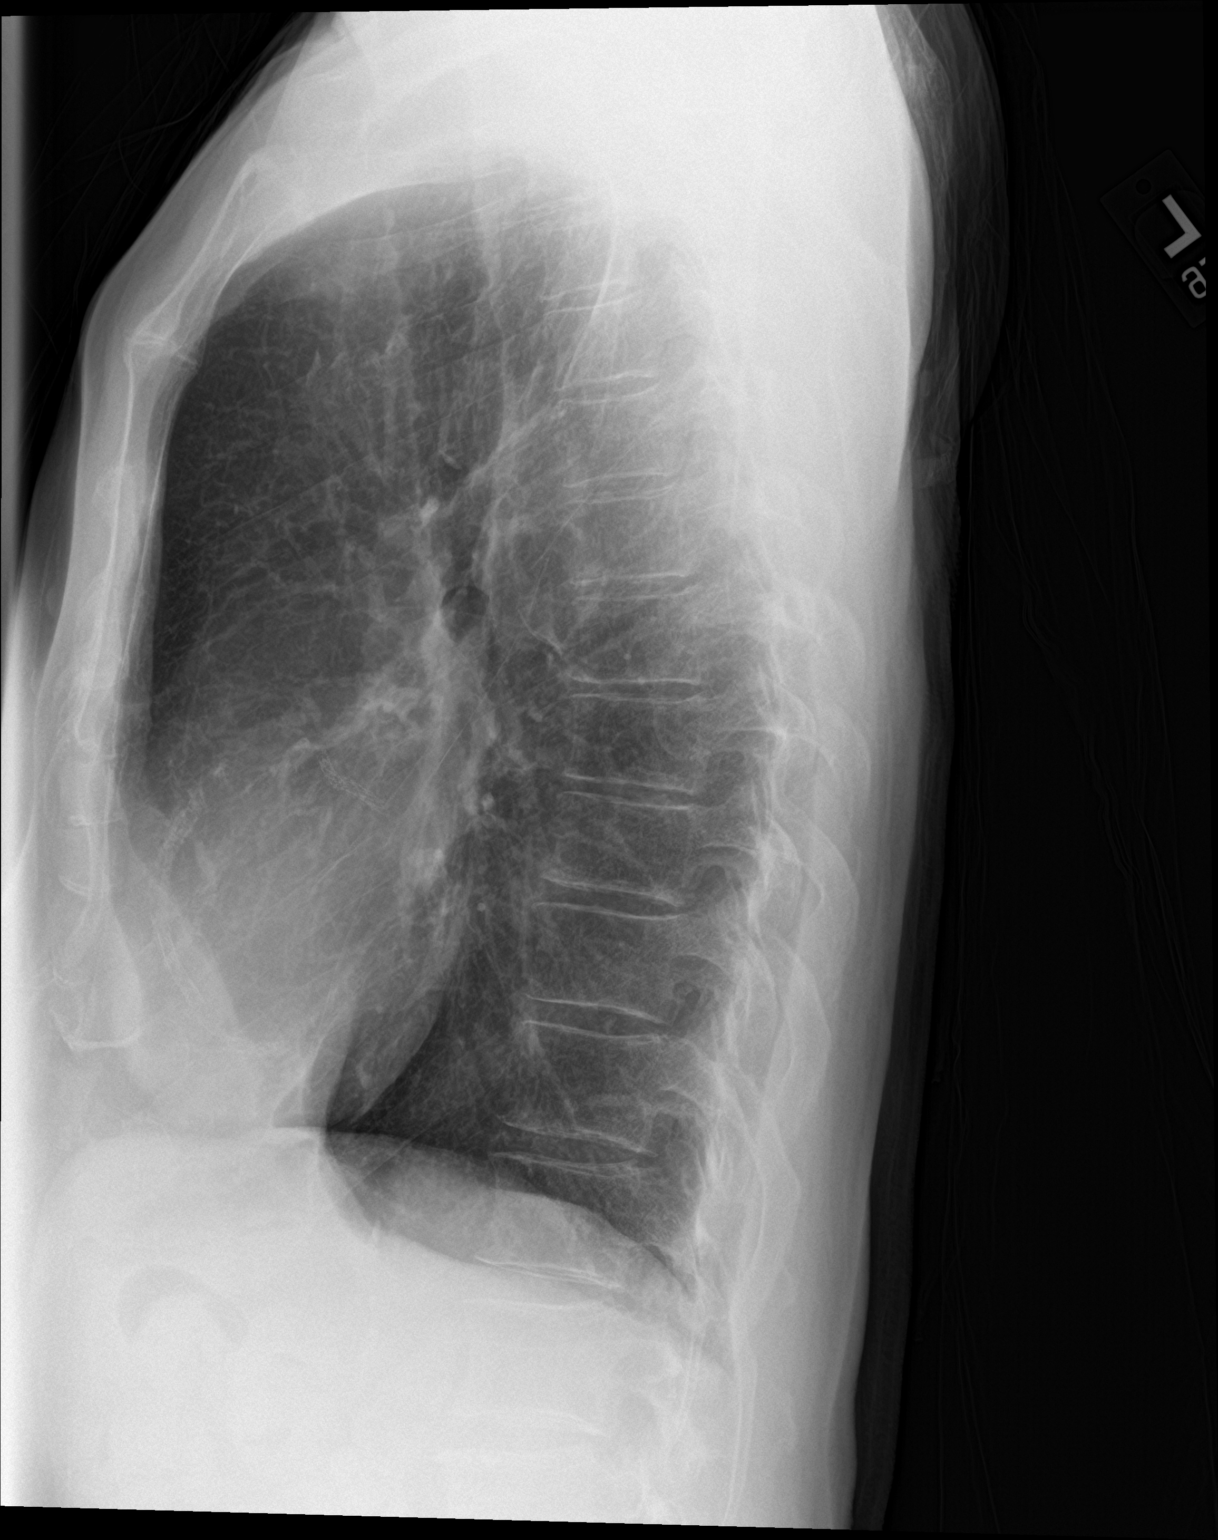

[2 of 2 positions shown; findings below may reference images not displayed]

FINDINGS: The lungs are hyperinflated. There is biapical pleuroparenchymal
scarring. The heart is normal in size. Coronary stents are
visualized. Smoothly marginated opacity abutting the lower left
heart border likely represents a prominent epicardial fat pad, but
is nonspecific in the absence of priors for comparison. There is no
pulmonary edema, pleural effusion, or pneumothorax. No acute osseous
findings.
IMPRESSION: 1. Smoothly marginated opacity abutting the lower left heart border
likely represents a prominent epicardial fat pad, but is nonspecific
in the absence of prior exams to establish stability. Consider
short-term interval follow-up exam in 1-2 months or further
assessment with chest CT, as clinically indicated.
2. Hyperinflation, often seen in the setting of COPD.

## 2024-01-26 ENCOUNTER — Ambulatory Visit (HOSPITAL_COMMUNITY)
Admission: RE | Admit: 2024-01-26 | Discharge: 2024-01-26 | Disposition: A | Discharge status: Home / Self Care | Source: Ambulatory Visit | Attending: Cardiology | Admitting: Cardiology

## 2024-01-26 DIAGNOSIS — R0989 Other specified symptoms and signs involving the circulatory and respiratory systems: Secondary | ICD-10-CM

## 2024-01-29 ENCOUNTER — Telehealth: Payer: Self-pay

## 2024-01-29 NOTE — Telephone Encounter (Signed)
 Spoke with pt. Pt was notified of carotid ultrasound results and voiced understanding.

## 2024-01-29 NOTE — Telephone Encounter (Signed)
 Lmom to discus VAS ultrasound results. Waiting on a return call. Letter sent to Sheryll Donovan, MD per Dr. Lavonne Prairie.

## 2024-02-01 IMAGING — US US ABDOMINAL AORTA SCREENING AAA
1 series · 14 of 25 positions shown · non-contrast
Comparison: None Available.

CLINICAL DATA: smoker; AAA screening

EXAM:
US ABDOMINAL AORTA MEDICARE SCREENING
TECHNIQUE: Ultrasound examination of the abdominal aorta was performed as a
screening evaluation for abdominal aortic aneurysm.

[Series 1: us aorta medicare screening · 14 of 25 slices shown]
[im 1/25]
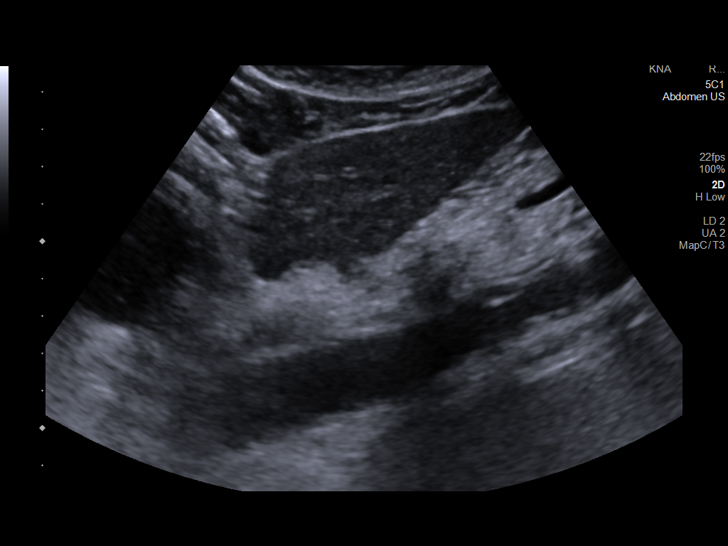
[im 3/25]
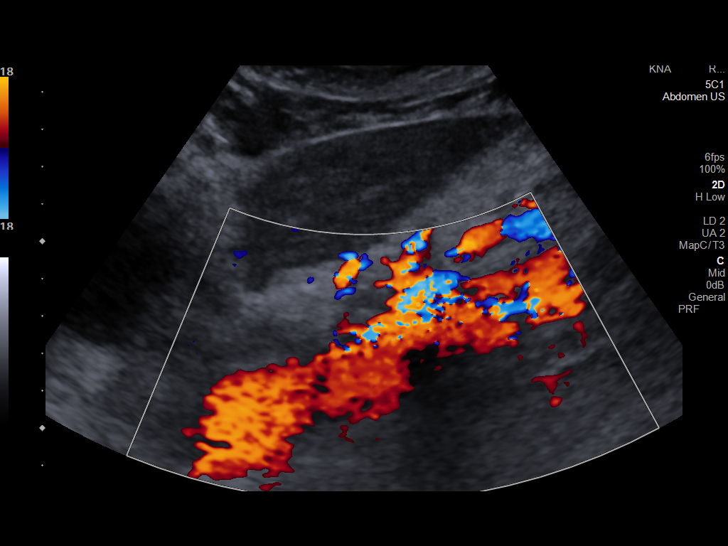
[im 5/25]
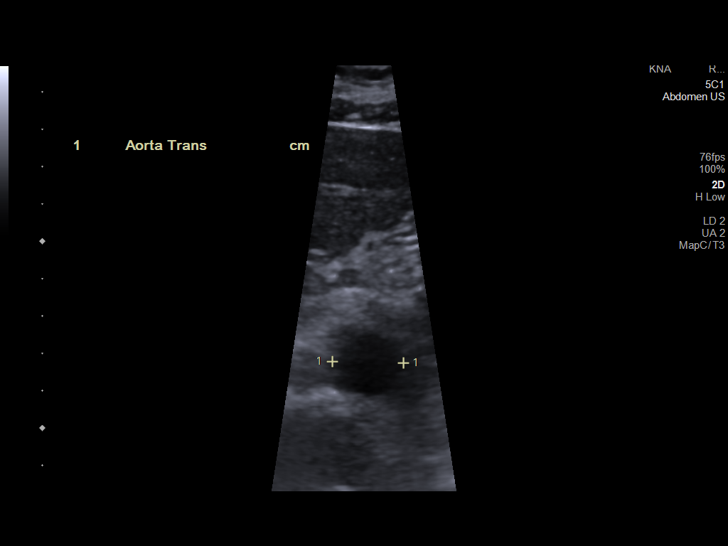
[im 7/25]
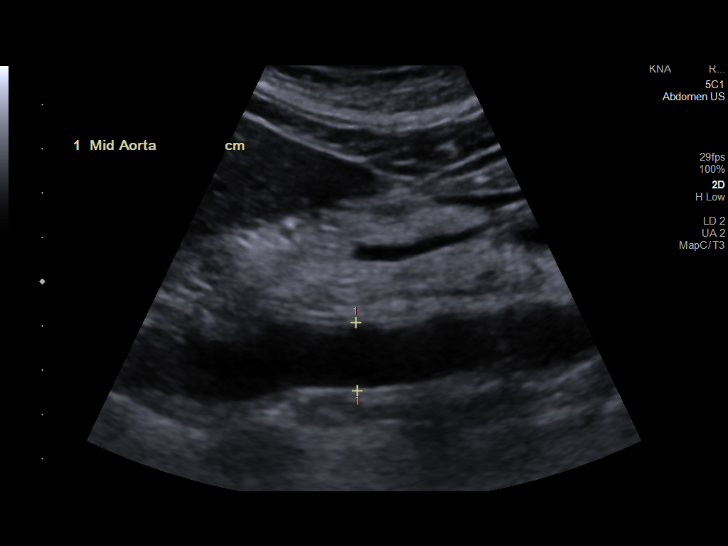
[im 9/25]
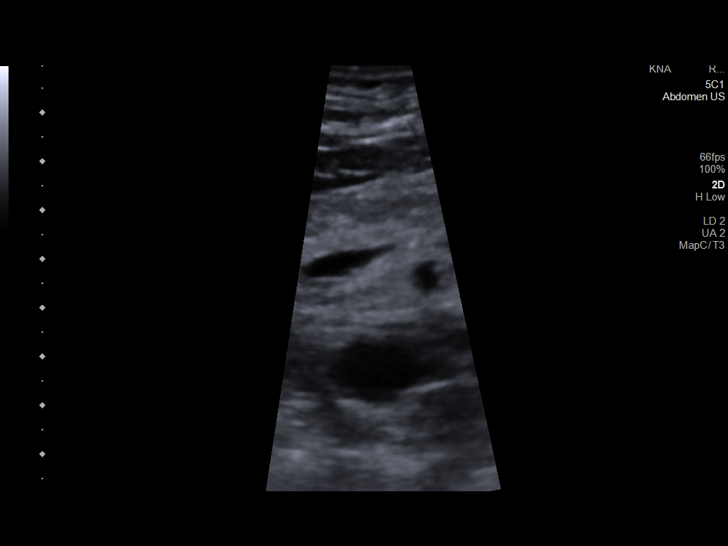
[im 10/25]
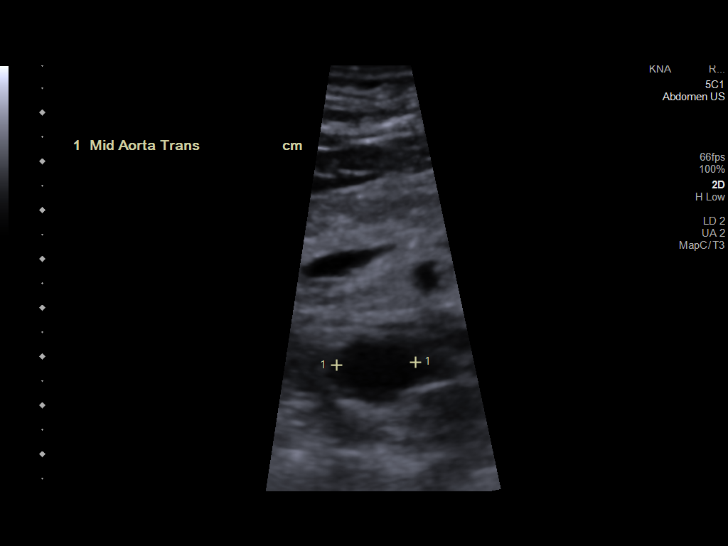
[im 12/25]
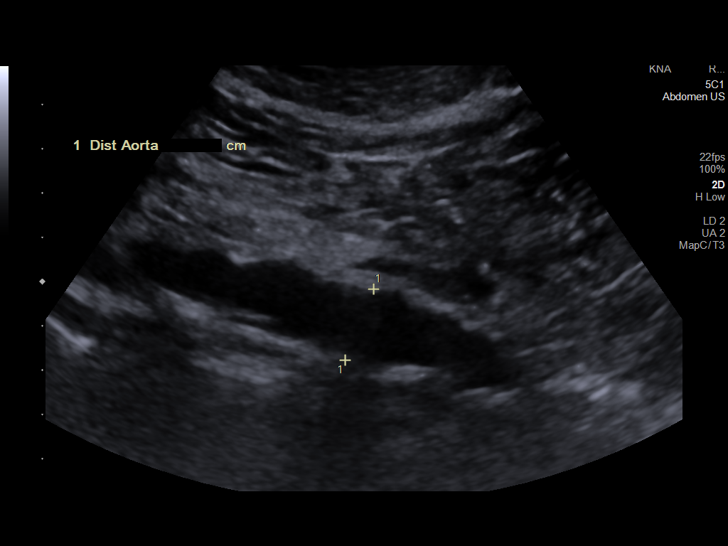
[im 14/25]
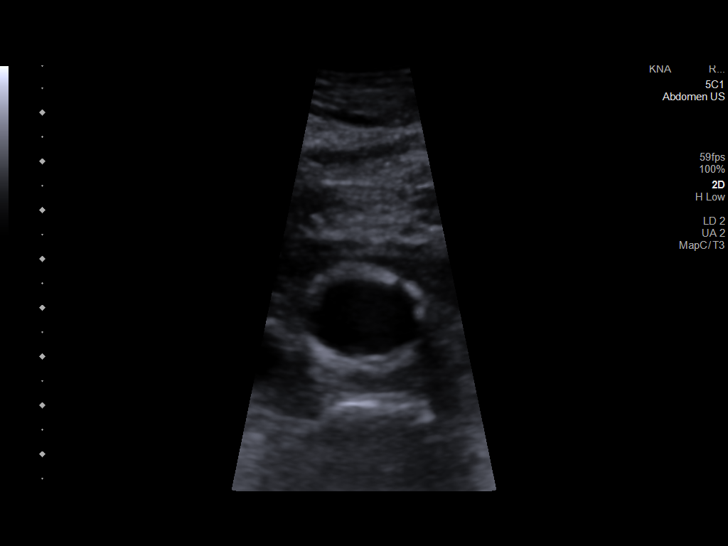
[im 16/25]
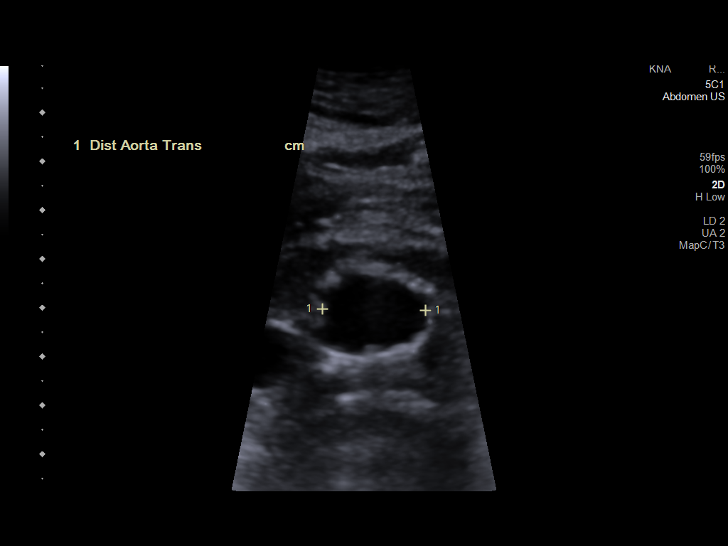
[im 17/25]
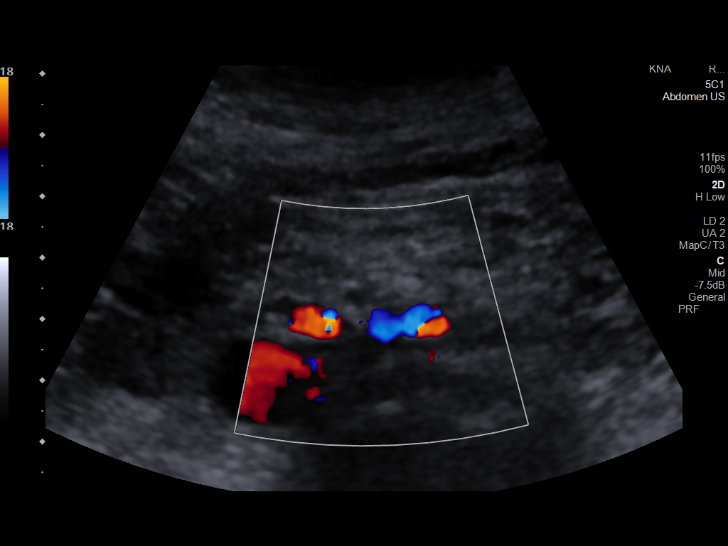
[im 19/25]
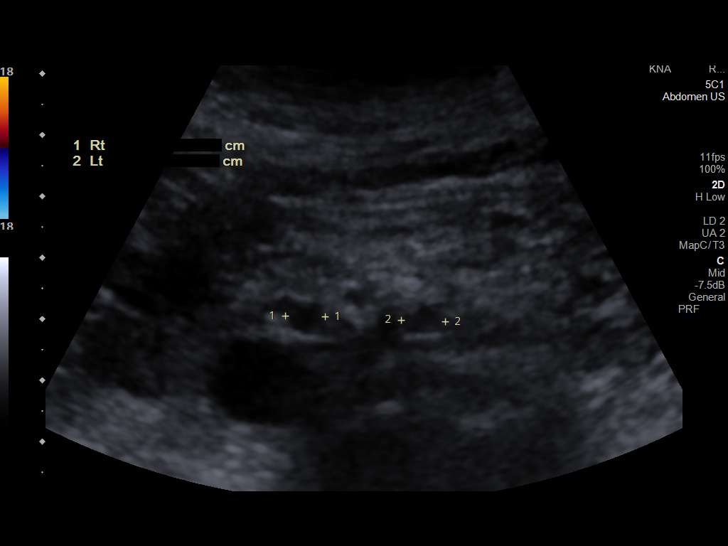
[im 21/25]
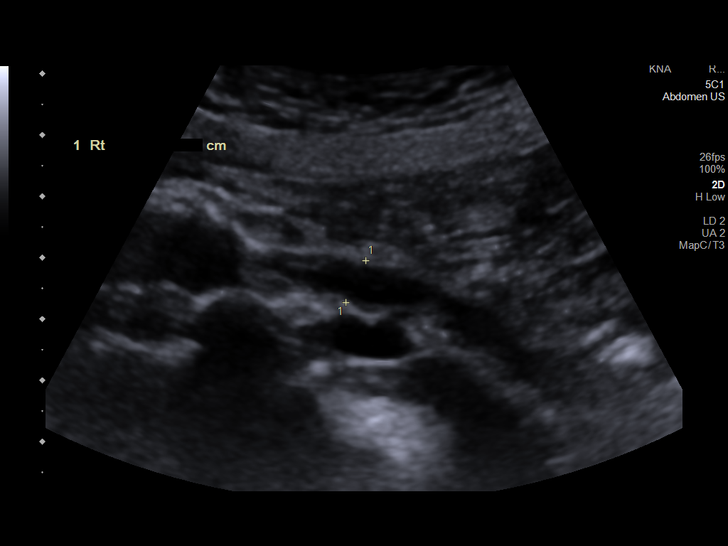
[im 23/25]
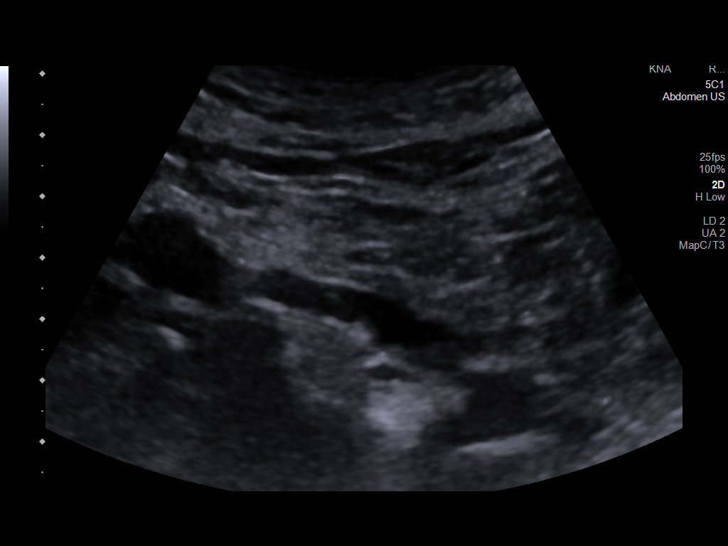
[im 25/25]
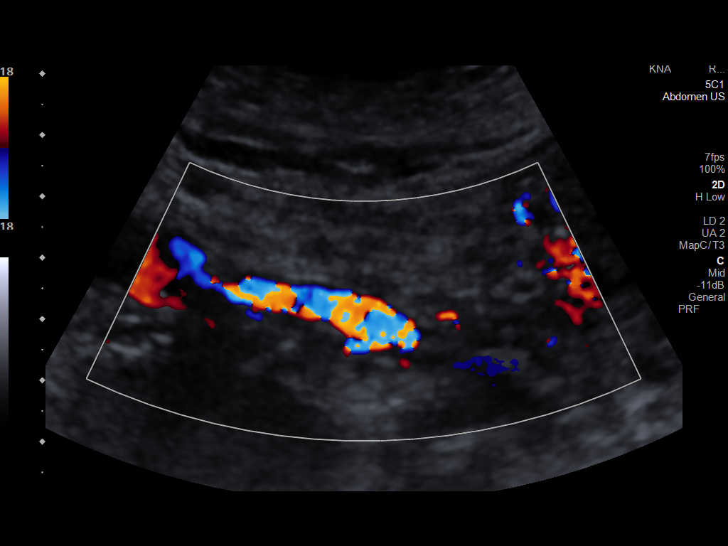

[14 of 25 positions shown; findings below may reference images not displayed]

FINDINGS: Abdominal aortic measurements as follows:

Proximal:  2.4 x 1.9 cm

Mid:  1.5 x 1.6 cm

Distal:  1.7 x 2.1 cm
IMPRESSION: No abdominal aortic aneurysm is visualized.

## 2024-02-06 NOTE — Progress Notes (Unsigned)
 Cardiology Office Note:   Date:  02/07/2024  ID:  Arthur Sullivan 1946-10-16, MRN [id number] PCP: Sheryll Donovan, MD  [facility] Providers Cardiologist:  Eilleen Grates, MD {  History of Present Illness:   Arthur Sullivan is a 78 y.o. male who presents for evaluation of CAD.  He said in 2001 he had 4 stents at [facility].  In 2004 he had 1 stent.  In March 2022 he was at [facility].  He ruled in for NSTEMI initially but left AGAINST MEDICAL ADVICE.  He had recurrent chest discomfort and came in and was found to have LAD 20 to 30% stenosis, left circumflex 99% stenosis and a previous stent and it was not thought to be amenable to PCI.  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  In early April at [facility] catheterization demonstrated that the RCA was patent.  At [facility] it looks like they were not able to do a balloon angioplasty but could not pass a stent.  This was described as a tortuous origin with calcification of this OM.  No stent was deployed.  They suggested that no further attempts at the OM and revascularize should be attempted because of the poor target.  I sent him for evaluation of SOB and he has moderately severe airway obstruction.  I sent him for ABIs for foot pain and these were normal.     Cardic cath in Jan 2023 demonstrated an occluded circ with patent stents in the RCA.  Since I saw him in 2023 he was hospitalized in February 2020 for a wait for his.  I had to review these notes.  Looks like he had a microcytic anemia with a hemoglobin down to 6.6 and required transfusion.  He did have evidence of diastolic heart failure.  I see an echocardiogram done at that time with an EF being 50 to 60%.  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  He had some moderately elevated pulmonary pressures.  He apparently received a little diuresis.  He tells me that he had 2 heart attacks but I do not see the troponin and apparently there were no acute EKG changes  and he did not require cardiac catheterization.  He returns for follow-up.  He denies any chest pressure, neck or arm discomfort.  He has no palpitations, presyncope or syncope.  He has some chronic dyspnea but is not describing PND or orthopnea.  He has some chronic leg pain which was evaluated previously with no significant findings on ABIs.   ROS:   Positive for back pain, neuropathy. Otherwise as stated in the HPI and negative for all other systems. \  Studies Reviewed:    EKG:   EKG Interpretation Date/Time:  Wednesday February 07 2024 10:41:59 EDT Ventricular Rate:  70 PR Interval:  156 QRS Duration:  88 QT Interval:  400 QTC Calculation: 432 R Axis:   88  Text Interpretation: Normal sinus rhythm Biatrial enlargement Nonspecific ST abnormality No change from previous Confirmed by Eilleen Grates ([id number]) on 02/07/2024 10:51:43 AM    Risk Assessment/Calculations:              Physical Exam:   VS:  BP 112/60   Pulse 70   Ht 5\' 5"  (1.651 m)   Wt 117 lb (53.1 kg)   BMI 19.47 kg/m    Wt Readings from Last 3 Encounters:  02/07/24 117 lb (53.1 kg)  09/02/22 130 lb 12.8  oz (59.3 kg)  06/28/22 129 lb (58.5 kg)    GEN: No  acute distress.   Neck: No  JVD Cardiac: RRR, 2 out of 6 apical systolic murmur radiating slightly to the axilla, no diastolic murmurs, rubs, or gallops.  Respiratory: Clear   to auscultation bilaterally. GI: Soft, nontender, non-distended, normal bowel sounds  MS:  No edema; No deformity.,  Decreased dorsalis pedis and posttibial's bilaterally Neuro:   Nonfocal  Psych: Oriented and appropriate    ASSESSMENT AND PLAN:      SOB:   I did review the records as above for this office visit.  He has some diastolic dysfunction but seems to be euvolemic.  We talked about as needed dosing of his diuretic.  No further workup is suggested.   TOBACCO ABUSE: We talked again about the need to stop smoking.  He has no intention of quitting.  CAD: I do not see that  he had any acute cardiac events at the time of his hospitalization last year.  He says he had "2 heart attacks".  I suspect he might of had some enzyme leak or type II with his significant anemia.  However, in the absence of any further change in symptoms he needs continued risk reduction but no further testing.   CAROTID BRUITS:      He had internal carotid 50 to 69% stenosis.  This was done earlier this month.  I will follow this up with repeat Doppler in a year.     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  No change in therapy.      Follow up with me in 1 year.  Signed, Eilleen Grates, MD

## 2024-02-07 ENCOUNTER — Ambulatory Visit (INDEPENDENT_AMBULATORY_CARE_PROVIDER_SITE_OTHER): Payer: 59 | Admitting: Cardiology

## 2024-02-07 ENCOUNTER — Encounter: Payer: Self-pay | Admitting: Cardiology

## 2024-02-07 VITALS — BP 112/60 | HR 70 | Ht 65.0 in | Wt 117.0 lb

## 2024-02-07 DIAGNOSIS — R0602 Shortness of breath: Secondary | ICD-10-CM

## 2024-02-07 DIAGNOSIS — Z720 Tobacco use: Secondary | ICD-10-CM | POA: Diagnosis not present

## 2024-02-07 DIAGNOSIS — I2510 Atherosclerotic heart disease of native coronary artery without angina pectoris: Secondary | ICD-10-CM

## 2024-02-07 DIAGNOSIS — G8929 Other chronic pain: Secondary | ICD-10-CM | POA: Diagnosis not present

## 2024-02-07 DIAGNOSIS — R070 Pain in throat: Secondary | ICD-10-CM

## 2024-02-07 DIAGNOSIS — R0989 Other specified symptoms and signs involving the circulatory and respiratory systems: Secondary | ICD-10-CM

## 2024-02-07 DIAGNOSIS — I061 Rheumatic aortic insufficiency: Secondary | ICD-10-CM

## 2024-02-07 NOTE — Patient Instructions (Signed)
 Medication Instructions:  The current medical regimen is effective;  continue present plan and medications.  *If you need a refill on your cardiac medications before your next appointment, please call your pharmacy*  Follow-Up: At [facility], you and your health needs are our priority.  As part of our continuing mission to provide you with exceptional heart care, our providers are all part of one team.  This team includes your primary Cardiologist (physician) and Advanced Practice Providers or APPs (Physician Assistants and Nurse Practitioners) who all work together to provide you with the care you need, when you need it.  Your next appointment:   1 year(s)  Provider:   Rollene Rotunda, MD    We recommend signing up for the patient portal called "MyChart".  Sign up information is provided on this After Visit Summary.  MyChart is used to connect with patients for Virtual Visits (Telemedicine).  Patients are able to view lab/test results, encounter notes, upcoming appointments, etc.  Non-urgent messages can be sent to your provider as well.   To learn more about what you can do with MyChart, go to [email or web address].

## 2024-02-15 ENCOUNTER — Other Ambulatory Visit: Payer: Self-pay | Admitting: Cardiology
